# Patient Record
Sex: Female | Born: 1990 | Race: White | Hispanic: No | Marital: Single | Smoking: Current every day smoker
Health system: Southern US, Community
[De-identification: ages and names within clinical notes are randomized; demographics above are authoritative.]

## PROBLEM LIST (undated history)

## (undated) DIAGNOSIS — J45909 Unspecified asthma, uncomplicated: Secondary | ICD-10-CM

## (undated) DIAGNOSIS — T7840XA Allergy, unspecified, initial encounter: Secondary | ICD-10-CM

## (undated) DIAGNOSIS — F32A Depression, unspecified: Secondary | ICD-10-CM

## (undated) DIAGNOSIS — F329 Major depressive disorder, single episode, unspecified: Secondary | ICD-10-CM

## (undated) HISTORY — DX: Allergy, unspecified, initial encounter: T78.40XA

## (undated) HISTORY — DX: Unspecified asthma, uncomplicated: J45.909

## (undated) HISTORY — DX: Depression, unspecified: F32.A

## (undated) HISTORY — DX: Major depressive disorder, single episode, unspecified: F32.9

---
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
## 2012-10-11 ENCOUNTER — Ambulatory Visit (INDEPENDENT_AMBULATORY_CARE_PROVIDER_SITE_OTHER): Payer: Commercial Managed Care - PPO | Admitting: Internal Medicine

## 2012-10-11 VITALS — BP 128/83 | HR 94 | Temp 98.2°F | Resp 16 | Ht 71.0 in | Wt 277.2 lb

## 2012-10-11 DIAGNOSIS — N912 Amenorrhea, unspecified: Secondary | ICD-10-CM

## 2012-10-11 DIAGNOSIS — Z6838 Body mass index (BMI) 38.0-38.9, adult: Secondary | ICD-10-CM

## 2012-10-11 DIAGNOSIS — F17200 Nicotine dependence, unspecified, uncomplicated: Secondary | ICD-10-CM

## 2012-10-11 LAB — POCT URINE PREGNANCY: Preg Test, Ur: POSITIVE

## 2012-10-11 NOTE — Progress Notes (Signed)
  Subjective:    Patient ID: Donna Lloyd, female    DOB: 1991/02/08, 22 y.o.   MRN: [id number]  HPI1 week late Nausea all week vom x 4 today in early am Partner 7 mos -no contracept  No abd/pelv pain No vag d/c  Never preg Review of Systems     Objective:   Physical Exam BP 128/83  Pulse 94  Temp 98.2 F (36.8 C) (Oral)  Resp 16  Ht 5\' 11"  (1.803 m)  Wt 277 lb 3.2 oz (125.737 kg)  BMI 38.66 kg/m2  SpO2 100% NAD Oriented Mood good       Assessment & Plan:  Amenorrhea due to early Pregnancy  Wants to discuss alternatives w/ current partner To [facility]-maternity if carrying to term

## 2012-10-12 ENCOUNTER — Encounter: Payer: Self-pay | Admitting: Internal Medicine

## 2012-10-12 DIAGNOSIS — Z6838 Body mass index (BMI) 38.0-38.9, adult: Secondary | ICD-10-CM | POA: Insufficient documentation

## 2012-10-12 DIAGNOSIS — F17200 Nicotine dependence, unspecified, uncomplicated: Secondary | ICD-10-CM | POA: Insufficient documentation

## 2019-06-22 ENCOUNTER — Other Ambulatory Visit: Payer: Self-pay | Admitting: Registered"

## 2019-06-22 DIAGNOSIS — Z20822 Contact with and (suspected) exposure to covid-19: Secondary | ICD-10-CM

## 2019-06-23 LAB — NOVEL CORONAVIRUS, NAA: SARS-CoV-2, NAA: NOT DETECTED

## 2019-08-11 ENCOUNTER — Other Ambulatory Visit: Payer: Self-pay

## 2019-08-11 DIAGNOSIS — Z20822 Contact with and (suspected) exposure to covid-19: Secondary | ICD-10-CM

## 2019-08-12 LAB — NOVEL CORONAVIRUS, NAA: SARS-CoV-2, NAA: DETECTED — AB

## 2019-11-19 ENCOUNTER — Encounter: Payer: Self-pay | Admitting: Emergency Medicine

## 2019-11-19 ENCOUNTER — Emergency Department (HOSPITAL_COMMUNITY): Payer: No Typology Code available for payment source

## 2019-11-19 ENCOUNTER — Emergency Department (HOSPITAL_COMMUNITY)
Admission: EM | Admit: 2019-11-19 | Discharge: 2019-11-19 | Disposition: A | Discharge status: Home / Self Care | Payer: No Typology Code available for payment source | Attending: Emergency Medicine | Admitting: Emergency Medicine

## 2019-11-19 DIAGNOSIS — M546 Pain in thoracic spine: Secondary | ICD-10-CM | POA: Insufficient documentation

## 2019-11-19 DIAGNOSIS — R519 Headache, unspecified: Secondary | ICD-10-CM | POA: Diagnosis not present

## 2019-11-19 DIAGNOSIS — F17210 Nicotine dependence, cigarettes, uncomplicated: Secondary | ICD-10-CM | POA: Insufficient documentation

## 2019-11-19 DIAGNOSIS — S0990XA Unspecified injury of head, initial encounter: Secondary | ICD-10-CM | POA: Insufficient documentation

## 2019-11-19 DIAGNOSIS — M542 Cervicalgia: Secondary | ICD-10-CM | POA: Insufficient documentation

## 2019-11-19 DIAGNOSIS — Y939 Activity, unspecified: Secondary | ICD-10-CM | POA: Insufficient documentation

## 2019-11-19 DIAGNOSIS — Y929 Unspecified place or not applicable: Secondary | ICD-10-CM | POA: Diagnosis not present

## 2019-11-19 DIAGNOSIS — M545 Low back pain, unspecified: Secondary | ICD-10-CM

## 2019-11-19 DIAGNOSIS — Y999 Unspecified external cause status: Secondary | ICD-10-CM | POA: Diagnosis not present

## 2019-11-19 LAB — I-STAT BETA HCG BLOOD, ED (MC, WL, AP ONLY): I-stat hCG, quantitative: <5 m[IU]/mL (ref <5)

## 2019-11-19 MED ORDER — METHOCARBAMOL 500 MG PO TABS: 500.0000 mg | ORAL_TABLET | Freq: Two times a day (BID) | ORAL | 0 refills | Status: DC

## 2019-11-19 MED ORDER — ACETAMINOPHEN 325 MG PO TABS
650.0000 mg | ORAL_TABLET | Freq: Once | ORAL | Status: AC
  Administered 2019-11-19: 650 mg via ORAL
  Filled 2019-11-19: qty 2

## 2019-11-19 NOTE — ED Notes (Signed)
Patient transported to imaging.

## 2019-11-19 NOTE — Discharge Instructions (Addendum)
Take Tylenol or Motrin as needed for pain.  Take Robaxin as a for muscle spasms.  Do not drive or operate heavy machinery on Robaxin.  Follow-up with your primary care provider in 2 days for continued evaluation.  Return to the emergency room immediately for new or worsening symptoms or concerns such as difficulty urinating, pooping on yourself, numbness, tingling, difficulty walking or any concerns at all.
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
## 2019-11-19 NOTE — ED Notes (Signed)
Patient Alert and oriented to baseline. Stable and ambulatory to baseline. Patient verbalized understanding of the discharge instructions.  Patient belongings were taken by the patient.   

## 2020-04-28 DIAGNOSIS — G8929 Other chronic pain: Secondary | ICD-10-CM | POA: Diagnosis not present

## 2020-04-28 DIAGNOSIS — M5441 Lumbago with sciatica, right side: Secondary | ICD-10-CM | POA: Diagnosis not present

## 2020-04-28 DIAGNOSIS — M5442 Lumbago with sciatica, left side: Secondary | ICD-10-CM | POA: Diagnosis not present
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
## 2021-01-24 DIAGNOSIS — M5430 Sciatica, unspecified side: Secondary | ICD-10-CM | POA: Diagnosis not present

## 2021-01-24 DIAGNOSIS — Z3040 Encounter for surveillance of contraceptives, unspecified: Secondary | ICD-10-CM | POA: Diagnosis not present

## 2021-01-24 DIAGNOSIS — Z01419 Encounter for gynecological examination (general) (routine) without abnormal findings: Secondary | ICD-10-CM | POA: Diagnosis not present

## 2021-01-24 DIAGNOSIS — Z01411 Encounter for gynecological examination (general) (routine) with abnormal findings: Secondary | ICD-10-CM | POA: Diagnosis not present

## 2021-01-24 DIAGNOSIS — Z124 Encounter for screening for malignant neoplasm of cervix: Secondary | ICD-10-CM | POA: Diagnosis not present

## 2021-01-24 DIAGNOSIS — M545 Low back pain, unspecified: Secondary | ICD-10-CM | POA: Diagnosis not present

## 2021-02-02 DIAGNOSIS — Z30430 Encounter for insertion of intrauterine contraceptive device: Secondary | ICD-10-CM | POA: Diagnosis not present

## 2021-02-13 DIAGNOSIS — Z113 Encounter for screening for infections with a predominantly sexual mode of transmission: Secondary | ICD-10-CM | POA: Diagnosis not present

## 2021-02-13 DIAGNOSIS — A5900 Urogenital trichomoniasis, unspecified: Secondary | ICD-10-CM | POA: Diagnosis not present

## 2021-02-13 DIAGNOSIS — Z118 Encounter for screening for other infectious and parasitic diseases: Secondary | ICD-10-CM | POA: Diagnosis not present

## 2021-02-16 DIAGNOSIS — A5901 Trichomonal vulvovaginitis: Secondary | ICD-10-CM | POA: Diagnosis not present

## 2021-02-16 DIAGNOSIS — Z113 Encounter for screening for infections with a predominantly sexual mode of transmission: Secondary | ICD-10-CM | POA: Diagnosis not present

## 2021-02-16 DIAGNOSIS — N760 Acute vaginitis: Secondary | ICD-10-CM | POA: Diagnosis not present

## 2021-02-16 DIAGNOSIS — B373 Candidiasis of vulva and vagina: Secondary | ICD-10-CM | POA: Diagnosis not present

## 2021-02-23 DIAGNOSIS — A749 Chlamydial infection, unspecified: Secondary | ICD-10-CM | POA: Diagnosis not present

## 2021-03-21 DIAGNOSIS — M545 Low back pain, unspecified: Secondary | ICD-10-CM | POA: Diagnosis not present

## 2021-03-21 DIAGNOSIS — Z789 Other specified health status: Secondary | ICD-10-CM | POA: Diagnosis not present

## 2021-03-21 DIAGNOSIS — Z7409 Other reduced mobility: Secondary | ICD-10-CM | POA: Diagnosis not present

## 2021-03-27 DIAGNOSIS — Z789 Other specified health status: Secondary | ICD-10-CM | POA: Diagnosis not present

## 2021-03-27 DIAGNOSIS — Z7409 Other reduced mobility: Secondary | ICD-10-CM | POA: Diagnosis not present

## 2021-03-27 DIAGNOSIS — M545 Low back pain, unspecified: Secondary | ICD-10-CM | POA: Diagnosis not present

## 2021-04-10 DIAGNOSIS — M545 Low back pain, unspecified: Secondary | ICD-10-CM | POA: Diagnosis not present

## 2021-04-10 DIAGNOSIS — Z789 Other specified health status: Secondary | ICD-10-CM | POA: Diagnosis not present

## 2021-04-10 DIAGNOSIS — Z7409 Other reduced mobility: Secondary | ICD-10-CM | POA: Diagnosis not present

## 2021-05-03 DIAGNOSIS — Z113 Encounter for screening for infections with a predominantly sexual mode of transmission: Secondary | ICD-10-CM | POA: Diagnosis not present

## 2021-05-16 DIAGNOSIS — Z30433 Encounter for removal and reinsertion of intrauterine contraceptive device: Secondary | ICD-10-CM | POA: Diagnosis not present

## 2021-05-16 DIAGNOSIS — I10 Essential (primary) hypertension: Secondary | ICD-10-CM | POA: Diagnosis not present

## 2021-07-16 DIAGNOSIS — G43911 Migraine, unspecified, intractable, with status migrainosus: Secondary | ICD-10-CM | POA: Diagnosis not present

## 2021-08-14 DIAGNOSIS — Z30431 Encounter for routine checking of intrauterine contraceptive device: Secondary | ICD-10-CM | POA: Diagnosis not present

## 2021-08-14 DIAGNOSIS — F17210 Nicotine dependence, cigarettes, uncomplicated: Secondary | ICD-10-CM | POA: Diagnosis not present

## 2021-08-14 DIAGNOSIS — I10 Essential (primary) hypertension: Secondary | ICD-10-CM | POA: Diagnosis not present

## 2021-09-14 DIAGNOSIS — R059 Cough, unspecified: Secondary | ICD-10-CM | POA: Diagnosis not present

## 2021-09-14 DIAGNOSIS — J029 Acute pharyngitis, unspecified: Secondary | ICD-10-CM | POA: Diagnosis not present

## 2021-09-14 DIAGNOSIS — J101 Influenza due to other identified influenza virus with other respiratory manifestations: Secondary | ICD-10-CM | POA: Diagnosis not present

## 2021-09-14 DIAGNOSIS — R509 Fever, unspecified: Secondary | ICD-10-CM | POA: Diagnosis not present

## 2022-01-29 ENCOUNTER — Other Ambulatory Visit (HOSPITAL_COMMUNITY)
Admission: EM | Admit: 2022-01-29 | Discharge: 2022-01-31 | Disposition: A | Discharge status: Home / Self Care | Payer: BC Managed Care – PPO | Attending: Psychiatry | Admitting: Psychiatry

## 2022-01-29 DIAGNOSIS — Z79899 Other long term (current) drug therapy: Secondary | ICD-10-CM | POA: Diagnosis not present

## 2022-01-29 DIAGNOSIS — Z20822 Contact with and (suspected) exposure to covid-19: Secondary | ICD-10-CM | POA: Diagnosis not present

## 2022-01-29 DIAGNOSIS — F32A Depression, unspecified: Secondary | ICD-10-CM

## 2022-01-29 DIAGNOSIS — F329 Major depressive disorder, single episode, unspecified: Secondary | ICD-10-CM | POA: Insufficient documentation

## 2022-01-29 DIAGNOSIS — J45909 Unspecified asthma, uncomplicated: Secondary | ICD-10-CM | POA: Insufficient documentation

## 2022-01-29 DIAGNOSIS — F411 Generalized anxiety disorder: Secondary | ICD-10-CM | POA: Insufficient documentation

## 2022-01-29 DIAGNOSIS — I10 Essential (primary) hypertension: Secondary | ICD-10-CM | POA: Insufficient documentation

## 2022-01-29 DIAGNOSIS — R45851 Suicidal ideations: Secondary | ICD-10-CM | POA: Diagnosis not present

## 2022-01-29 DIAGNOSIS — F419 Anxiety disorder, unspecified: Secondary | ICD-10-CM | POA: Diagnosis not present

## 2022-01-29 LAB — CBC WITH DIFFERENTIAL/PLATELET
Abs Immature Granulocytes: 0.03 10*3/uL (ref 0.00–0.07)
Basophils Absolute: 0.1 10*3/uL (ref 0.0–0.1)
Basophils Relative: 1 %
Eosinophils Absolute: 0.2 10*3/uL (ref 0.0–0.5)
Eosinophils Relative: 2 %
HCT: 42.4 % (ref 36.0–46.0)
Hemoglobin: 13.7 g/dL (ref 12.0–15.0)
Immature Granulocytes: 0 %
Lymphocytes Relative: 34 %
Lymphs Abs: 3.5 10*3/uL (ref 0.7–4.0)
MCH: 28.5 pg (ref 26.0–34.0)
MCHC: 32.3 g/dL (ref 30.0–36.0)
MCV: 88.3 fL (ref 80.0–100.0)
Monocytes Absolute: 0.6 10*3/uL (ref 0.1–1.0)
Monocytes Relative: 6 %
Neutro Abs: 6 10*3/uL (ref 1.7–7.7)
Neutrophils Relative %: 57 %
Platelets: 246 10*3/uL (ref 150–400)
RBC: 4.8 MIL/uL (ref 3.87–5.11)
RDW: 12.9 % (ref 11.5–15.5)
WBC: 10.4 10*3/uL (ref 4.0–10.5)
nRBC: 0 % (ref 0.0–0.2)

## 2022-01-29 LAB — COMPREHENSIVE METABOLIC PANEL
ALT: 34 U/L (ref 0–44)
AST: 21 U/L (ref 15–41)
Albumin: 4 g/dL (ref 3.5–5.0)
Alkaline Phosphatase: 57 U/L (ref 38–126)
Anion gap: 7 (ref 5–15)
BUN: 14 mg/dL (ref 6–20)
CO2: 26 mmol/L (ref 22–32)
Calcium: 9.2 mg/dL (ref 8.9–10.3)
Chloride: 108 mmol/L (ref 98–111)
Creatinine, Ser: 0.72 mg/dL (ref 0.44–1.00)
GFR, Estimated: >60 mL/min (ref >60)
Glucose, Bld: 82 mg/dL (ref 70–99)
Potassium: 3.9 mmol/L (ref 3.5–5.1)
Sodium: 141 mmol/L (ref 135–145)
Total Bilirubin: 0.9 mg/dL (ref 0.3–1.2)
Total Protein: 6.5 g/dL (ref 6.5–8.1)

## 2022-01-29 LAB — POCT PREGNANCY, URINE: Preg Test, Ur: NEGATIVE

## 2022-01-29 LAB — POCT URINE DRUG SCREEN - MANUAL ENTRY (I-SCREEN)
POC Amphetamine UR: NOT DETECTED
POC Buprenorphine (BUP): NOT DETECTED
POC Cocaine UR: NOT DETECTED
POC Marijuana UR: NOT DETECTED
POC Methadone UR: NOT DETECTED
POC Methamphetamine UR: NOT DETECTED
POC Morphine: NOT DETECTED
POC Oxazepam (BZO): POSITIVE — AB
POC Oxycodone UR: NOT DETECTED
POC Secobarbital (BAR): NOT DETECTED

## 2022-01-29 LAB — RESP PANEL BY RT-PCR (FLU A&B, COVID) ARPGX2
Influenza A by PCR: NEGATIVE
Influenza B by PCR: NEGATIVE
SARS Coronavirus 2 by RT PCR: NEGATIVE

## 2022-01-29 LAB — POC SARS CORONAVIRUS 2 AG: SARSCOV2ONAVIRUS 2 AG: NEGATIVE

## 2022-01-29 LAB — HEMOGLOBIN A1C
Hgb A1c MFr Bld: 5.1 % (ref 4.8–5.6)
Mean Plasma Glucose: 99.67 mg/dL

## 2022-01-29 LAB — TSH: TSH: 2.827 u[IU]/mL (ref 0.350–4.500)

## 2022-01-29 LAB — ETHANOL: Alcohol, Ethyl (B): <10 mg/dL (ref <10)

## 2022-01-29 MED ORDER — HYDROXYZINE HCL 25 MG PO TABS
25.0000 mg | ORAL_TABLET | Freq: Three times a day (TID) | ORAL | Status: DC | PRN
  Filled 2022-01-29: qty 10

## 2022-01-29 MED ORDER — HYDROCHLOROTHIAZIDE 12.5 MG PO TABS
12.5000 mg | ORAL_TABLET | Freq: Every day | ORAL | Status: DC
  Administered 2022-01-30 – 2022-01-31 (×2): 12.5 mg via ORAL
  Filled 2022-01-29 (×2): qty 1

## 2022-01-29 MED ORDER — ACETAMINOPHEN 325 MG PO TABS
650.0000 mg | ORAL_TABLET | Freq: Four times a day (QID) | ORAL | Status: DC | PRN
  Administered 2022-01-30 – 2022-01-31 (×3): 650 mg via ORAL
  Filled 2022-01-29 (×4): qty 2

## 2022-01-29 MED ORDER — MAGNESIUM HYDROXIDE 400 MG/5ML PO SUSP: 30.0000 mL | Freq: Every day | ORAL | Status: DC | PRN

## 2022-01-29 MED ORDER — ALUM & MAG HYDROXIDE-SIMETH 200-200-20 MG/5ML PO SUSP: 30.0000 mL | ORAL | Status: DC | PRN
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
## 2022-01-29 NOTE — ED Provider Notes (Signed)
[facility] Continuous Assessment Admission H&P ? ?Date: 01/30/22 ?Patient Name: Donna Lloyd ?MRN: [id number] ?Chief Complaint: No chief complaint on file. ?   ? ?Diagnoses:  ?Final diagnoses:  ?Suicidal ideation  ?Depression, unspecified depression type  ?Anxious mood  ? ? ?HPI: Donna Lloyd,  31 y.o female present to [facility] for suicidal ideation and depression,  per the patient thing has not been going good in her life.  She feel overwhelm at this point and don't feel safe going home at this point.   ? ?.Copied from TTS notes: Pt stated that in the last month she has had "hit after hit" of negative occurences. 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 Pt endorsed passive SI in the form of thinking "it would be easier if I weren't here." Pt denied any plan or true intent of acting to kill herself. Pt endorsed thoughts at times when her daughter is disagreeable of harming her. Pt categorized her thoughts as "passing thoughts" and stated strongly "but I would never harm her." Pt reported that she has never attempted to kill herself but had SI with a plan "back in high school." Pt stated she was hospitalized after that episode and went through months of OP therapy following the attempt. Pt reported a hx of self-harming through superficial cutting which had stopped for about 10 years until January 2023. Pt stated that she does not SH often but is continuing to cut herself periodically.  ? ?Observation of patient,  she is alert and oriented x 4,  speech is clear,  mood depressed and teary,  affect congruent with mood.  Pt endorse SI with no immediate plan.  Pt denies AVH,  HI or paranoia,  pt report she vape,  drink alcohol 1-2 time weekly (2-3 shot at time).  Pt report there is a gun in the  home but it locked away and the key are no accessible by her.   Per the patient she does not feel safe going home tonight because of her mental state.   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  Pt reported she was diagnose with depression when she was a teenager but not currently taking any medications.   ? ?Recommend inpatient observation  ? ?PHQ 2-9:   ?[facility]  ?C-SSRS RISK CATEGORY High Risk  ? ?  ?  ? ?Total Time spent with patient: 20 minutes ? ?Musculoskeletal  ?Strength & Muscle Tone: within normal limits ?Gait & Station: normal ?Patient leans: N/A ? ?Psychiatric Specialty Exam  ?Presentation ?General Appearance: Casual ? ?Eye Contact:Good ? ?Speech:Clear and Coherent ? ?Speech Volume:Normal ? ?Handedness:Ambidextrous ? ? ?Mood and Affect  ?Mood:Depressed; Anxious ? ?Affect:Appropriate ? ? ?Thought Process  ?Thought Processes:Coherent ? ?Descriptions of Associations:Circumstantial ? ?Orientation:Full (Time, Place and Person) ? ?Thought Content:Abstract Reasoning ?   ?Hallucinations:Hallucinations: None ? ?Ideas of Reference:None ? ?Suicidal Thoughts:Suicidal Thoughts: Yes, Passive ?SI Passive Intent and/or Plan: Without Plan ? ?Homicidal Thoughts:Homicidal Thoughts: No ? ? ?Sensorium  ?Memory:Immediate Good ? ?Judgment:Fair ? ?Insight:Fair ? ? ?Executive Functions  ?Concentration:Fair ? ?Attention Span:Fair ? ?Recall:Fair ? ?Fund of Knowledge:Fair ? ?Language:Fair ? ? ?Psychomotor Activity  ?Psychomotor Activity:Psychomotor Activity: Normal ? ? ?Assets  ?Assets:No data recorded ? ?  Sleep  ?Sleep:Sleep: Fair ? ? ?Nutritional Assessment (For OBS and [facility] admissions only) ?Has the patient had a weight loss or gain of 10 pounds or more in the last 3 months?: No ?Has the patient had a decrease in food intake/or appetite?: No ?Does the patient have dental problems?: No ?Does the patient have eating habits or behaviors that may be indicators of  an eating disorder including binging or inducing vomiting?: No ?Has the patient recently lost weight without trying?: 0 ?Has the patient been eating poorly because of a decreased appetite?: 0 ?Malnutrition Screening Tool Score: 0 ? ? ? ?Physical Exam ?HENT:  ?   Head: Normocephalic.  ?   Nose: Congestion present.  ?Cardiovascular:  ?   Rate and Rhythm: Normal rate.  ?Pulmonary:  ?   Effort: Pulmonary effort is normal.  ?Musculoskeletal:     ?   General: Normal range of motion.  ?   Cervical back: Normal range of motion.  ?Skin: ?   General: Skin is warm.  ?Neurological:  ?   General: No focal deficit present.  ?   Mental Status: She is alert.  ?Psychiatric:     ?   Mood and Affect: Mood normal.     ?   Behavior: Behavior normal.     ?   Thought Content: Thought content normal.     ?   Judgment: Judgment normal.  ? ?Review of Systems  ?Constitutional: Negative.   ?HENT: Negative.    ?Eyes: Negative.   ?Respiratory: Negative.    ?Cardiovascular: Negative.   ?Gastrointestinal: Negative.   ?Genitourinary: Negative.   ?Musculoskeletal: Negative.   ?Skin: Negative.   ?Neurological: Negative.   ?Endo/Heme/Allergies: Negative.   ?Psychiatric/Behavioral:  Positive for depression and suicidal ideas. The patient is nervous/anxious.   ? ?Resp. rate (P) 16. There is no height or weight on file to calculate BMI. ? ?Past Psychiatric History: depression   ? ?Is the patient at risk to self? Yes  ?Has the patient been a risk to self in the past 6 months? Yes .    ?Has the patient been a risk to self within the distant past? Yes   ?Is the patient a risk to others? No   ?Has the patient been a risk to others in the past 6 months? No   ?Has the patient been a risk to others within the distant past? No  ? ?Past Medical History:  ?Past Medical History:  ?Diagnosis Date  ? Allergy   ? Asthma   ? Depression   ? No past surgical history on file. ? ?Family History: No family history on file. ? ?Social History:  ?Social History   ? ?Socioeconomic History  ? Marital status: Single  ?  Spouse name: Not on file  ? Number of children: Not on file  ? Years of education: Not on file  ? Highest education level: Not on file  ?Occupational History  ? Not on file  ?Tobacco Use  ? Smoking status: Every Day  ? Smokeless tobacco: Current  ?Substance and Sexual Activity  ? Alcohol use: Yes  ? Drug use: Never  ? Sexual activity: Not on file  ?Other Topics Concern  ? Not on file  ?Social History Narrative  ? Not on file  ? ?Social Determinants of Health  ? ?Financial Resource Strain: Not on file  ?Food Insecurity: Not on file  ?Transportation Needs: Not on file  ?Physical Activity: Not on file  ?Stress: Not on file  ?  Social Connections: Not on file  ?Intimate Partner Violence: Not on file  ? ? ?SDOH:  ?SDOH Screenings  ? ?Alcohol Screen: Not on file  ?Depression (PHQ2-9): Not on file  ?Financial Resource Strain: Not on file  ?Food Insecurity: Not on file  ?Housing: Not on file  ?Physical Activity: Not on file  ?Social Connections: Not on file  ?Stress: Not on file  ?Tobacco Use: Not on file  ?Transportation Needs: Not on file  ? ? ?Last Labs:  ?Admission on 01/29/2022  ?Component Date Value Ref Range Status  ? SARS Coronavirus 2 by RT PCR 01/29/2022 NEGATIVE  NEGATIVE Final  ? Comment: (NOTE) ?SARS-CoV-2 target nucleic acids are NOT DETECTED. ? ?The SARS-CoV-2 RNA is generally detectable in upper respiratory ?specimens during the acute phase of infection. The lowest ?concentration of SARS-CoV-2 viral copies this assay can detect is ?138 copies/mL. A negative result does not preclude SARS-Cov-2 ?infection and should not be used as the sole basis for treatment or ?other patient management decisions. A negative result may occur with  ?improper specimen collection/handling, submission of specimen other ?than nasopharyngeal swab, presence of viral mutation(s) within the ?areas targeted by this assay, and inadequate number of viral ?copies(<138 copies/mL). A  negative result must be combined with ?clinical observations, patient history, and epidemiological ?information. The expected result is Negative. ? ?Fact Sheet for Patients:  ?[email or web address]

## 2022-01-29 NOTE — ED Notes (Signed)
Patient is alert and oriented x 4. Patient was very ?

## 2022-01-29 NOTE — Progress Notes (Signed)
?   01/29/22 1844  ?[facility] Triage Screening (Walk-ins at [facility] only)  ?How Did You Hear About [place]? Self  ?What Is the Reason for Your Visit/Call Today? Pt is a 31 yo female who presented voluntarily and accompanied by a friend who did not participate in the assessment.  Pt reported worsening depression over the last few months beginning in late December when her best friend attempted suicide which resulted medical issues her friend is recovering from now. Pt stated that in the last month she has had "hit after hit" of negative occurences. 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 Pt endorsed passive SI in the form of thinking "it would be easier if I weren't here." Pt denied any plan or true intent of acting to kill herself. Pt endorsed thoughts at times when her daughter is disagreeable of harming her. Pt categorized her thoughts as "passing thoughts" and stated strongly "but I would never harm her." Pt reported that she has never attempted to kill herself but had SI with a plan "back in high school." Pt stated she was hospitalized after that episode and went through months of OP therapy following the attempt. Pt reported a hx of self-harming through superficial cutting which had stopped for about 10 years until January 2023. Pt stated that she does not SH often but is continuing to cut herself periodically. Pt reported regular use of alcohol (1-2 times per week) and xanax (use of 10 pills over a month's time.) Pt is not prescribed Xanax or any other medications and does not currently have an OP therapist.  ?How Long Has This Been Causing You Problems? 1-6 months  ?Have You Recently Had Any Thoughts About Hurting Yourself? Yes  ?How long ago did you have thoughts about hurting yourself? today   ?Are You Planning to Commit Suicide/Harm Yourself At This time? No  ?Have you Recently Had Thoughts About Hurting Someone Karolee Ohs? Yes  ?How long ago did you have thoughts of harming others? last night  ?Are You Planning To Harm Someone At This Time? No  ?Are you currently experiencing any auditory, visual or other hallucinations? No  ?Have You Used Any Alcohol or Drugs in the Past 24 Hours? Yes  ?How long ago did you use Drugs or Alcohol? Xanax and alcohol yesterday and today  ?What Did You Use and How Much? 10 shots of alcohol  ?Clinician description of patient physical appearance/behavior: Pt was calm, cooperative, alert and seemed fully oriented. Pt did not appear to be responding to internal stiluli or experiencing delsuional thinking. Pt's mood was depressed and she was tearful. Her flat, tearful affect was congruent. Pt's judgment and insight seemed fair. Pt was casually dressed and seemed adequately groomed. Pt's speech and movement was normal.  ?What Do You Feel Would Help You the Most Today? Treatment for Depression or other mood problem  ?If access to [facility] was not available, would you have sought care in the Emergency Department? Yes  ?Determination of Need Routine (7 days)  ?Options For Referral Outpatient Therapy;Medication Management  ? ?[place] T. Jimmye Norman, MS, [facility], [facility] ?Triage Specialist ?[place] ? ?

## 2022-01-29 NOTE — ED Notes (Signed)
Patient is alert and oriented x 4. Patient was very tearful when asked what precipitated her current admission. 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 She also found out that her boyfriend was cheating on her and seeing prostitutes. There are many factors that have contributed to her current depression and anxiety. Patient states that she feels better now that she is not alone. She feels safe here.Patient is resting quietly with eyes closed. No S/S of distress, respirations are even and unlabored. Will contiune to monitor for safety. ?

## 2022-01-30 DIAGNOSIS — R45851 Suicidal ideations: Secondary | ICD-10-CM | POA: Diagnosis not present

## 2022-01-30 MED ORDER — ALBUTEROL SULFATE HFA 108 (90 BASE) MCG/ACT IN AERS: 1.0000 | INHALATION_SPRAY | RESPIRATORY_TRACT | Status: DC | PRN

## 2022-01-30 MED ORDER — CITALOPRAM HYDROBROMIDE 20 MG PO TABS
20.0000 mg | ORAL_TABLET | Freq: Every day | ORAL | Status: DC
  Administered 2022-01-30 – 2022-01-31 (×2): 20 mg via ORAL
  Filled 2022-01-30: qty 1
  Filled 2022-01-30: qty 7
  Filled 2022-01-30: qty 1

## 2022-01-30 NOTE — ED Notes (Signed)
Pt went for EKG ?

## 2022-01-30 NOTE — ED Notes (Signed)
Pt is currently sleeping, no distress noted, environmental check complete, will continue to monitor patient for safety. ? ?

## 2022-01-30 NOTE — ED Notes (Signed)
Pt tearful during assessment. Pt continue to endorse SI no plan/intent. Denies HI/AVH. Pt states, "I have so much going on in my life right now that I can't think straight. But, I wouldn't do anything to hurt myself. I have a 29 yr old daughter who is my world. I would never put her through that". Praise and encouragement provided. Informed pt to notify staff with any needs, concerns or impulses or urges to harm self. Will continue to monitor for safety. ?

## 2022-01-30 NOTE — ED Provider Notes (Addendum)
Facility Based Crisis Admission H&P ? ?Date: 01/30/22 ?Patient Name: Donna Lloyd ?MRN: [id number] ?Chief Complaint: No chief complaint on file. ?   ?Diagnoses:  ?Final diagnoses:  ?Suicidal ideation  ?Depression, unspecified depression type  ?Anxious mood  ? ?HPI:   ? ?Pt assessed face to face by NP. ?  ?Pt reports depressed mood. Reports depression has been present since age 31 or 31 years old, worsening in the last 6-7 months. Reports multiple recent stressors, including her job, relationship, car, living situation. Reports she found out that her boyfriend was "texting hookers".  ?  ?Endorses passive SI, "thinking it would be better it I wasn't here", "be less of a burden". Denies active SI. Denies current plan. Pt states she is able to contract to safety when she has her daughter, which is from [place]. Pt states she is unable to contract to safety when she does not have her daughter.  ?  ?Denies hx of SA.  ?  ?Reports hx of NSSI, cutting self. Reports last occurred in January 2023.  ? ?Denies HI/VI.  ?  ?Denies AVH, paranoia, delusions. ?  ?Reports 1 inpatient psychiatric hospitalization in high school, estimates around 2010, for "suicidal thoughts". ?  ?Pt is not currently connected w/ counseling or medication management. Reports prior medication trial of celexa, which was effective for depressive sx. Had last taken celexa prior to her pregnancy.  ?  ?Pt is currently living w/ her boyfriend. States she will need to figure out housing as she plans on ending this relationship. Reports her boyfriend has a firearm he keeps locked away. She does not have access. ?  ?Endorses use of Xanax, which she uses PRN, 2-3 times/week. She is not rx'd Xanax. Endorses use of alcohol, 2-3 shots, 2-3 times/week. Provided psychoeducation that pt should not use medications that are not prescribed for her, pt should not use alcohol and benzodiazepines together. Pt states she has not been using alcohol and benzodiazepines  together. Reports hx of crack/cocaine use, last use was 4 years ago. Denies use of methamphetamines, amphetamines, other substances. ?  ?[facility] to restart [facility]lexa. Pt agrees w/ plan. Will start celexa 20mg  QD. Side effects/adverse effects and black box warning of celexa reviewed w/ pt. Pt verbalized understanding and gave verbal consent. ? ?Pt is a&ox3. Appears casual. Eye contact is good. Speech is clear and coherent, w/ nml rate and volume. Reported mood is depressed. Affect is congruent, tearful. TP is coherent, goal directed, linear. Description of associations is intact. TC is logical. There is no evidence of internal preoccupation, agitation, aggression, or distractibility. No paranoia or delusions elicited. ? ?PHQ 2-9:   ?[facility]  ?C-SSRS RISK CATEGORY High Risk  ? ?  ?  ?Total Time spent with patient: 15 minutes ? ?Musculoskeletal  ?Strength & Muscle Tone: within normal limits ?Gait & Station: normal ?Patient leans: N/A ? ?Psychiatric Specialty Exam  ?Presentation ?General Appearance: Casual ? ?Eye Contact:Good ? ?Speech:Clear and Coherent; Normal Rate ? ?Speech Volume:Normal ? ?Handedness: ? ?Mood and Affect  ?Mood:Depressed ? ?Affect:Congruent; Tearful ? ?Thought Process  ?Thought Processes:Coherent; Goal Directed; Linear ? ?Descriptions of Associations:Intact ? ?Orientation:Full (Time, Place and Person) ? ?Thought Content:Logical ?   ?Hallucinations:Hallucinations: None ? ?Ideas of Reference:None ? ?Suicidal Thoughts:Suicidal Thoughts: Yes, Passive ?SI Passive Intent and/or Plan: Without Plan ? ?Homicidal Thoughts:Homicidal Thoughts: No ? ?Sensorium  ?Memory:Immediate Good ? ?Judgment:Fair ? ?Insight:Fair ? ? ?Executive Functions  ?  Concentration:Fair ? ?Attention Span:Fair ? ?Recall:Fair ? ?Fund of Knowledge:Fair ? ?Language:Fair ? ? ?Psychomotor Activity  ?Psychomotor Activity:Psychomotor Activity:  Normal ? ? ?Assets  ?Assets:Communication Skills; Desire for Improvement; Social Support ? ? ?Sleep  ?Sleep:Sleep: Fair ?Number of Hours of Sleep: 3 ? ? ?Nutritional Assessment (For [facility] and [facility] admissions only) ?Has the patient had a weight loss or gain of 10 pounds or more in the last 3 months?: No ?Has the patient had a decrease in food intake/or appetite?: No ?Does the patient have dental problems?: No ?Does the patient have eating habits or behaviors that may be indicators of an eating disorder including binging or inducing vomiting?: No ?Has the patient recently lost weight without trying?: 0 ?Has the patient been eating poorly because of a decreased appetite?: 0 ?Malnutrition Screening Tool Score: 0 ? ? ? ?Physical Exam ?Cardiovascular:  ?   Rate and Rhythm: Normal rate.  ?Pulmonary:  ?   Effort: Pulmonary effort is normal.  ?Neurological:  ?   Mental Status: She is alert and oriented to person, place, and time.  ?Psychiatric:     ?   Attention and Perception: Attention and perception normal.     ?   Mood and Affect: Mood is depressed. Affect is tearful.     ?   Speech: Speech normal.     ?   Behavior: Behavior is cooperative.     ?   Thought Content: Thought content includes suicidal ideation.     ?   Cognition and Memory: Cognition and memory normal.     ?   Judgment: Judgment normal.  ? ?Review of Systems  ?Constitutional:  Negative for chills and fever.  ?Respiratory:  Negative for shortness of breath.   ?Cardiovascular:  Negative for chest pain.  ?Gastrointestinal:  Negative for abdominal pain.  ?Psychiatric/Behavioral:  Positive for depression and suicidal ideas.   ? ?Blood pressure 129/67, pulse 72, temperature 97.6 ?F (36.4 ?C), temperature source Oral, resp. rate 18, SpO2 100 %. There is no height or weight on file to calculate BMI. ? ?Past Psychiatric History:Reported past hx of depression. ? ?Is the patient at risk to self? Yes  ?Has the patient been a risk to self in the past 6 months? No .    ?Has  the patient been a risk to self within the distant past? Yes   ?Is the patient a risk to others? No   ?Has the patient been a risk to others in the past 6 months? No   ?Has the patient been a risk to others within the distant past? No  ? ?Past Medical History:  ?Past Medical History:  ?Diagnosis Date  ? Allergy   ? Asthma   ? Depression   ? No past surgical history on file. ? ?Family History: No family history on file. ? ?Social History:  ?Social History  ? ?Socioeconomic History  ? Marital status: Single  ?  Spouse name: Not on file  ? Number of children: Not on file  ? Years of education: Not on file  ? Highest education level: Not on file  ?Occupational History  ? Not on file  ?Tobacco Use  ? Smoking status: Every Day  ? Smokeless tobacco: Current  ?Substance and Sexual Activity  ? Alcohol use: Yes  ? Drug use: Never  ? Sexual activity: Not on file  ?Other Topics Concern  ? Not on file  ?Social History Narrative  ? Not on file  ? ?Social Determinants of Health  ? ?  Financial Resource Strain: Not on file  ?Food Insecurity: Not on file  ?Transportation Needs: Not on file  ?Physical Activity: Not on file  ?Stress: Not on file  ?Social Connections: Not on file  ?Intimate Partner Violence: Not on file  ? ? ?SDOH:  ?SDOH Screenings  ? ?Alcohol Screen: Not on file  ?Depression (PHQ2-9): Not on file  ?Financial Resource Strain: Not on file  ?Food Insecurity: Not on file  ?Housing: Not on file  ?Physical Activity: Not on file  ?Social Connections: Not on file  ?Stress: Not on file  ?Tobacco Use: Not on file  ?Transportation Needs: Not on file  ? ? ?Last Labs:  ?Admission on 01/29/2022  ?Component Date Value Ref Range Status  ? SARS Coronavirus 2 by RT PCR 01/29/2022 NEGATIVE  NEGATIVE Final  ? Comment: (NOTE) ?SARS-CoV-2 target nucleic acids are NOT DETECTED. ? ?The SARS-CoV-2 RNA is generally detectable in upper respiratory ?specimens during the acute phase of infection. The lowest ?concentration of SARS-CoV-2 viral  copies this assay can detect is ?138 copies/mL. A negative result does not preclude SARS-Cov-2 ?infection and should not be used as the sole basis for treatment or ?other patient management decisions. A negative result

## 2022-01-30 NOTE — ED Notes (Signed)
Pt. Is attending AA. ?

## 2022-01-30 NOTE — ED Notes (Signed)
Pt. Came out asking for her brush out the locker I offered her a comb. Now she's on the phone talking to her family.  ?

## 2022-01-30 NOTE — ED Notes (Signed)
Pt is in the dining room attending AA ?

## 2022-01-30 NOTE — ED Notes (Signed)
PT complaint of headache, asking for tylenol ?

## 2022-01-30 NOTE — ED Notes (Signed)
Pt on phone with family member. She calm and cooperative. No c/o of pain or distress. Will continue to monitor for safety ?

## 2022-01-30 NOTE — ED Notes (Signed)
Pt eating breakfast 

## 2022-01-30 NOTE — ED Notes (Signed)
Patient continues to rest with no sxs of distress noted - will continue to observe for safety ?

## 2022-01-30 NOTE — ED Notes (Signed)
Pt sleeping in no acute distress. RR even and unlabored. Safety maintained. 

## 2022-01-30 NOTE — Clinical Social Work Psych Note (Signed)
LCSW Initial Note ? ?LCSW met with Rodolfo for introduction and to begin discussions regarding treatment and potential discharge planning. Tayanna presented with a dysphoric affect, congruent mood, however was pleasant and cooperative.  ? ?Athea denied having any current SI, HI or AVH at this time.  ? ?Naevia shared that she came to the [facility] due to increasing depressive symptoms, including suicidal ideations and increasing anxiety. Ashlynne shared that she has a PMHX of depression and substance use.  ? ?Keilany reports that she has currently been under a lot of stress and feels overwhelmed.  ? 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 Rashel shred that the main stressor that triggered this most recent episode was finding out her boyfriend cheating on her.  ? ?Aamani reports that she has experienced an increase in depression, passive suicidal ideation and anxiety symptooms. Noemi reports she lseeps for 2-3 hours a night on average, however she has been able to receive better quality sleep here in the [facility].  ? ?Siyona reports that her main goal is to be stabilized on medications  and be provided with additional resources for outpatient medication management and therapy services. Phyllicia reports she continues to live with her boyfriend, however she is planning to change their living arrangements upon her discharge. ? ?Avonell plans to return home with outpatient psychiatric resources.  ? ?LCSW will continue to follow for any additional questions, concerns or identified needs until discharge.  ? ? ? ?Radonna Ricker, MSW, LCSW ?Clinical Education officer, museum Insurance claims handler) ?[facility]  ? ?

## 2022-01-30 NOTE — Progress Notes (Signed)
Received Donna Lloyd from the [facility] with the [facility], she was presented with the necessay paper work and agreed to the treatment plan. She was oriented to her new environment. She completed her safety plan and a copy was given to her to take home. The second copy was placed in the chart. She endorsed feeling anxious at the present time. She demied feeling suicdal and depressed at this time. She retreated to her room resting in the bed at 1323 hrs. ?

## 2022-01-31 ENCOUNTER — Other Ambulatory Visit: Payer: Self-pay

## 2022-01-31 MED ORDER — HYDROXYZINE HCL 25 MG PO TABS
25.0000 mg | ORAL_TABLET | Freq: Three times a day (TID) | ORAL | 0 refills | Status: DC | PRN
Start: 2022-01-31 — End: 2022-05-03

## 2022-01-31 MED ORDER — HYDROCHLOROTHIAZIDE 12.5 MG PO CAPS: 12.5000 mg | ORAL_CAPSULE | Freq: Every morning | ORAL | 0 refills | Status: DC

## 2022-01-31 MED ORDER — HYDROCHLOROTHIAZIDE 12.5 MG PO TABS
12.5000 mg | ORAL_TABLET | Freq: Every day | ORAL | Status: DC
  Filled 2022-01-31: qty 7

## 2022-01-31 MED ORDER — CITALOPRAM HYDROBROMIDE 20 MG PO TABS: 20.0000 mg | ORAL_TABLET | Freq: Every day | ORAL | 0 refills | Status: DC

## 2022-01-31 NOTE — Group Note (Signed)
Group Topic: Emotional Regulation  ?Group Date: 01/31/2022 ?Start Time: 1200 ?End Time: 1226 ?Facilitators: Doyne Keel E  ?Department: [facility] ? ?Number of Participants: 3  ?Group Focus: activities of daily living skills, coping skills, and self-awareness ?Treatment Modality:  Behavior Modification Therapy and Individual Therapy ?Interventions utilized were patient education and problem solving ?Purpose: enhance coping skills and express feelings ? ?Name: Donna Lloyd Date of Birth: 1991/01/11  ?MR: [id number]   ? ?Level of Participation: minimal ?Quality of Participation: attentive and cooperative ?Interactions with others: gave feedback ?Mood/Affect: appropriate ?Triggers (if applicable): n/a ?Cognition: coherent/clear ?Progress: Moderate ?Response: n/a ?Plan: follow-up needed ? ?Patients Problems:  ?Patient Active Problem List  ? Diagnosis Date Noted  ? Suicidal ideation 01/30/2022  ? Nicotine addiction 10/12/2012  ? BMI 38.0-38.9,adult 10/12/2012  ?  ?

## 2022-01-31 NOTE — ED Provider Notes (Signed)
FBC/OBS ASAP Discharge Summary ? ?Date and Time: 01/31/2022 1:15 PM  ?Name: Donna Lloyd  ?MRN:  [id number]  ? ?Discharge Diagnoses:  ?Final diagnoses:  ?Suicidal ideation  ?Depression, unspecified depression type  ?Anxious mood  ? ? ?Subjective:  ?Patient seen and chart reviewed. She has been medication compliant and has been appropriate with staff and peers on the unit. She reported her mood as "good" today and denied SI/HI/AVH. She states that she has been in contact with her mother and that she will reside with her mother at discharge. She states that her mother went to her ex boyfriend's home and removed her and her daughter's belongings. Only physical complaint this morning is a headache which is not unusual for her; reports a history of headaches. She states that her mother will get off of work at 5/6pm and that she will pick her up.  Patient states that her mother has contacted [facility] and has arranged an appointment for her in the next 4 weeks. She requests discharge at this time. ? ?Stay Summary:  ?31 year old female with history of anxiety and depression who presented to the [facility] behavioral health urgent care on 01/29/2022 with worsening mood and passive SI; was unable to contract for safety.  Patient was admitted to the [facility] observation unit and then transferred to the facility based crisis unit on 01/30/2022 for crisis stabilization.  UDS positive for benzodiazepines; EtOH negative. She was restarted on celexa 20 mg which she historically has had a good response to. She tolerated the medication without issues, no SE/AE. She reported feeling safe for discharge on 01/31/22 and requested discharge.  ? ?On my interview day of discharge, patient is in NAD, alert, oriented, calm, cooperative, and attentive, with normal affect, speech, and behavior. Objectively, there is no evidence of psychosis/ mania (able to converse coherently, linear and goal directed thought, no RIS, no distractibility, not  pre-occupied, no FOI, etc) nor depression to the point of suicidality (able to concentrate, affect full and reactive, speech normal r/v/t, no psychomotor retardation/agitation, etc). She denied SI/HI/AVH as above ? ?Overall, patient appears to be at the point, in the absence of inhibiting or disinhibiting symptoms, where she can successfully move to lesser restrictive setting for care. ? ? ?Total Time spent with patient: 20 minutes ? ?Past Psychiatric History: anxiety, depression ?Past Medical History:  ?Past Medical History:  ?Diagnosis Date  ? Allergy   ? Asthma   ? Depression   ? No past surgical history on file. ?Family History: No family history on file. ?Family Psychiatric History:  ?Mother with depression  ?Father with Alcohol use ?  ?Social History:  ?Social History  ? ?Substance and Sexual Activity  ?Alcohol Use Yes  ?   ?Social History  ? ?Substance and Sexual Activity  ?Drug Use Never  ?  ?Social History  ? ?Socioeconomic History  ? Marital status: Single  ?  Spouse name: Not on file  ? Number of children: Not on file  ? Years of education: Not on file  ? Highest education level: Not on file  ?Occupational History  ? Not on file  ?Tobacco Use  ? Smoking status: Every Day  ? Smokeless tobacco: Current  ?Substance and Sexual Activity  ? Alcohol use: Yes  ? Drug use: Never  ? Sexual activity: Not on file  ?Other Topics Concern  ? Not on file  ?Social History Narrative  ? Not on file  ? ?Social Determinants of Health  ? ?Financial Resource Strain:  Not on file  ?Food Insecurity: Not on file  ?Transportation Needs: Not on file  ?Physical Activity: Not on file  ?Stress: Not on file  ?Social Connections: Not on file  ? ?SDOH:  ?SDOH Screenings  ? ?Alcohol Screen: Not on file  ?Depression (PHQ2-9): Medium Risk  ? PHQ-2 Score: 18  ?Financial Resource Strain: Not on file  ?Food Insecurity: Not on file  ?Housing: Not on file  ?Physical Activity: Not on file  ?Social Connections: Not on file  ?Stress: Not on file   ?Tobacco Use: Not on file  ?Transportation Needs: Not on file  ? ? ?Tobacco Cessation:  N/A, patient does not currently use tobacco products ? ?Current Medications:  ?Current Facility-Administered Medications  ?Medication Dose Route Frequency Provider Last Rate Last Admin  ? acetaminophen (TYLENOL) tablet 650 mg  650 mg Oral Q6H PRN Sindy Guadeloupe, NP   650 mg at 01/31/22 0921  ? albuterol (VENTOLIN HFA) 108 (90 Base) MCG/ACT inhaler 1-2 puff  1-2 puff Inhalation Q4H PRN Lauree Chandler, NP      ? alum & mag hydroxide-simeth (MAALOX/MYLANTA) 200-200-20 MG/5ML suspension 30 mL  30 mL Oral Q4H PRN Sindy Guadeloupe, NP      ? citalopram (CELEXA) tablet 20 mg  20 mg Oral Daily Lauree Chandler, NP   20 mg at 01/31/22 2703  ? hydrochlorothiazide (HYDRODIURIL) tablet 12.5 mg  12.5 mg Oral Daily Sindy Guadeloupe, NP   12.5 mg at 01/31/22 5009  ? hydrOXYzine (ATARAX) tablet 25 mg  25 mg Oral TID PRN Sindy Guadeloupe, NP      ? magnesium hydroxide (MILK OF MAGNESIA) suspension 30 mL  30 mL Oral Daily PRN Sindy Guadeloupe, NP      ? ?Current Outpatient Medications  ?Medication Sig Dispense Refill  ? albuterol (VENTOLIN HFA) 108 (90 Base) MCG/ACT inhaler Inhale 1-2 puffs into the lungs every 4 (four) hours as needed for shortness of breath.    ? ibuprofen (ADVIL) 200 MG tablet Take 400 mg by mouth every 6 (six) hours as needed for headache.    ? [START ON 02/01/2022] citalopram (CELEXA) 20 MG tablet Take 1 tablet (20 mg total) by mouth daily. 30 tablet 0  ? hydrochlorothiazide (MICROZIDE) 12.5 MG capsule Take 1 capsule (12.5 mg total) by mouth every morning. 30 capsule 0  ? hydrOXYzine (ATARAX) 25 MG tablet Take 1 tablet (25 mg total) by mouth 3 (three) times daily as needed for anxiety. 30 tablet 0  ? ? ?PTA Medications: (Not in a hospital admission) ? ? ?Musculoskeletal  ?Strength & Muscle Tone: within normal limits ?Gait & Station: normal ?Patient leans: N/A ? ?Psychiatric Specialty Exam  ?Presentation  ?General Appearance:  Appropriate for Environment; Casual ? ?Eye Contact:Good ? ?Speech:Clear and Coherent; Normal Rate ? ?Speech Volume:Normal ? ?Handedness:Ambidextrous ? ? ?Mood and Affect  ?Mood:-- ("good") ? ?Affect:Appropriate; Congruent ? ? ?Thought Process  ?Thought Processes:Coherent; Goal Directed; Linear ? ?Descriptions of Associations:Intact ? ?Orientation:Full (Time, Place and Person) ? ?Thought Content:WDL; Logical ?   ? Hallucinations:Hallucinations: None ? ?Ideas of Reference:None ? ?Suicidal Thoughts:Suicidal Thoughts: No ? ?Homicidal Thoughts:Homicidal Thoughts: No ? ? ?Sensorium  ?Memory:Immediate Good; Recent Good; Remote Good ? ?Judgment:Good ? ?Insight:Good ? ? ?Executive Functions  ?Concentration:Good ? ?Attention Span:Good ? ?Recall:Good ? ?Fund of Knowledge:Good ? ?Language:Good ? ? ?Psychomotor Activity  ?Psychomotor Activity:Psychomotor Activity: Normal ? ? ?Assets  ?Assets:Communication Skills; Desire for Improvement; Resilience; Social Support; Talents/Skills; Transportation; Vocational/Educational; Housing; Financial Resources/Insurance ? ? ?Sleep  ?Sleep:Sleep: Fair ?Number  of Hours of Sleep: 3 ? ? ?No data recorded ? ?Physical Exam  ?Physical Exam ?Constitutional:   ?   Appearance: Normal appearance. She is normal weight.  ?HENT:  ?   Head: Normocephalic and atraumatic.  ?Eyes:  ?   Extraocular Movements: Extraocular movements intact.  ?   Conjunctiva/sclera: Conjunctivae normal.  ?Cardiovascular:  ?   Rate and Rhythm: Normal rate and regular rhythm.  ?   Heart sounds: Normal heart sounds.  ?Pulmonary:  ?   Effort: Pulmonary effort is normal.  ?   Breath sounds: Normal breath sounds.  ?Neurological:  ?   Mental Status: She is alert and oriented to person, place, and time.  ? ?Review of Systems  ?Constitutional:  Negative for chills and fever.  ?HENT:  Negative for hearing loss.   ?Eyes:  Negative for discharge and redness.  ?Respiratory:  Negative for cough.   ?Cardiovascular:  Negative for chest pain.   ?Gastrointestinal:  Negative for abdominal pain.  ?Musculoskeletal:  Negative for myalgias.  ?Neurological:  Positive for headaches.  ?Psychiatric/Behavioral:  Negative for hallucinations, substance abuse and suici

## 2022-01-31 NOTE — Discharge Instructions (Signed)

## 2022-01-31 NOTE — ED Notes (Signed)
Snacks given 

## 2022-01-31 NOTE — ED Notes (Signed)
Pt sleeping@this time. Breathing even and unlabored. Will continue to monitor for safety 

## 2022-01-31 NOTE — ED Notes (Signed)
Patient alert and oriented x4. Sitting up in dining area watching TV. No distress noted. Ate breakfast. Will continue to monitor. ?

## 2022-02-15 ENCOUNTER — Ambulatory Visit (INDEPENDENT_AMBULATORY_CARE_PROVIDER_SITE_OTHER): Payer: BC Managed Care – PPO | Admitting: Adult Health

## 2022-02-15 ENCOUNTER — Encounter: Payer: Self-pay | Admitting: Adult Health

## 2022-02-15 VITALS — BP 137/84 | HR 73 | Ht 70.0 in | Wt 341.0 lb

## 2022-02-15 DIAGNOSIS — F331 Major depressive disorder, recurrent, moderate: Secondary | ICD-10-CM | POA: Diagnosis not present

## 2022-02-15 DIAGNOSIS — F410 Panic disorder [episodic paroxysmal anxiety] without agoraphobia: Secondary | ICD-10-CM | POA: Diagnosis not present

## 2022-02-15 DIAGNOSIS — F411 Generalized anxiety disorder: Secondary | ICD-10-CM | POA: Diagnosis not present

## 2022-02-15 DIAGNOSIS — G4700 Insomnia, unspecified: Secondary | ICD-10-CM | POA: Diagnosis not present

## 2022-02-15 MED ORDER — CITALOPRAM HYDROBROMIDE 20 MG PO TABS: 20.0000 mg | ORAL_TABLET | Freq: Every day | ORAL | 2 refills | Status: DC

## 2022-02-15 MED ORDER — TRAZODONE HCL 50 MG PO TABS: 50.0000 mg | ORAL_TABLET | Freq: Every day | ORAL | 2 refills | Status: DC

## 2022-02-15 NOTE — Progress Notes (Signed)
[facility] MD/PA/NP Initial Note  02/15/2022 12:03 PM Donna Lloyd  MRN:  [id number]  Chief Complaint:   HPI:   Patient seen today for initial psychiatric evaluation.  Recent seen at the [facility] - suicidal thoughts. Collateral reviewed.  Describes mood today as "ok". Pleasant. Denies tearfulness. Mood symptoms - reports decreased depression. Feels irritable at times. More anxious overall. Reports panic attacks - last one 2 weeks ago. Mood is consistent. Recently seen at [facility] for mood decompensation. Stayed 2 nights and was started on Celexa and Hydroxyzine. Stating "I am feeling better". Does not want to make any changes to the Celexa at this time. Does not feel like the Hydroxyzine is beneficial for sleep.  Stable interest and motivation. Taking medications as prescribed.  Energy levels - "middle of the road". Active, does not have a regular exercise routine - "starting to get back to it". Enjoys some usual interests and activities. Lives with boyfriend of 1.5 years. Has an 13 year old daughter - shares custody. Spending time with family. Appetite adequate. Weight loss - 341 pounds - 70". Sleeping difficulties. Averages 3 to 4 hours. Focus and concentration stable. Completing tasks. Managing aspects of household. 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 Denies SI or HI.  Denies AH or VH. History of self harm - cutting. 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 Consumes alcohol 3 drinks a week x 2 days a week.   Previous medication trials: Zoloft  Visit Diagnosis:    ICD-10-CM   1. Generalized anxiety disorder  F41.1 citalopram (CELEXA) 20 MG tablet    2. Major depressive disorder, recurrent episode, moderate (HCC)  F33.1 citalopram (CELEXA) 20 MG tablet    3. Panic attacks  F41.0 citalopram (CELEXA) 20 MG tablet    4. Insomnia, unspecified type  G47.00 traZODone (DESYREL) 50 MG tablet      Past Psychiatric History: Reports two previous admission. History of substance  abuse - drug free x 4 years.  Past Medical History:  Past Medical History:  Diagnosis Date   Allergy    Asthma    Depression    No past surgical history on file.  Family Psychiatric History: Family history of mental illness.   Family History: No family history on file.  Social History:  Social History   Socioeconomic History   Marital status: Single    Spouse name: Not on file   Number of children: Not on file   Years of education: Not on file   Highest education level: Not on file  Occupational History   Not on file  Tobacco Use   Smoking status: Every Day   Smokeless tobacco: Current  Substance and Sexual Activity   Alcohol use: Yes   Drug use: Never   Sexual activity: Not on file  Other Topics Concern   Not on file  Social History Narrative   Not on file   Social Determinants of Health   Financial Resource Strain: Not on file  Food Insecurity: Not on file  Transportation Needs: Not on file  Physical Activity: Not on file  Stress: Not on file  Social Connections: Not on file    Allergies:  Allergies  Allergen Reactions   Latex Hives   Penicillins Hives    Metabolic Disorder Labs: Lab Results  Component Value Date   HGBA1C 5.1 01/29/2022   MPG 99.67 01/29/2022   No results found for: PROLACTIN No results found for: CHOL, TRIG, HDL, CHOLHDL, VLDL, LDLCALC Lab  Results  Component Value Date   TSH 2.827 01/29/2022   TSH 2.484 Test methodology is 3rd generation TSH 08/05/2009    Therapeutic Level Labs: No results found for: LITHIUM No results found for: VALPROATE No components found for:  CBMZ  Current Medications: Current Outpatient Medications  Medication Sig Dispense Refill   traZODone (DESYREL) 50 MG tablet Take 1 tablet (50 mg total) by mouth at bedtime. 30 tablet 2   albuterol (VENTOLIN HFA) 108 (90 Base) MCG/ACT inhaler Inhale 1-2 puffs into the lungs every 4 (four) hours as needed for shortness of breath.     citalopram (CELEXA) 20 MG  tablet Take 1 tablet (20 mg total) by mouth daily. 30 tablet 2   hydrochlorothiazide (MICROZIDE) 12.5 MG capsule Take 1 capsule (12.5 mg total) by mouth every morning. 30 capsule 0   hydrOXYzine (ATARAX) 25 MG tablet Take 1 tablet (25 mg total) by mouth 3 (three) times daily as needed for anxiety. 30 tablet 0   ibuprofen (ADVIL) 200 MG tablet Take 400 mg by mouth every 6 (six) hours as needed for headache.     No current facility-administered medications for this visit.    Medication Side Effects: none  Orders placed this visit:  No orders of the defined types were placed in this encounter.   Psychiatric Specialty Exam:  Review of Systems  Musculoskeletal:  Negative for gait problem.  Neurological:  Negative for tremors.  Psychiatric/Behavioral:         Please refer to HPI   Blood pressure 137/84, pulse 73, height 5\' 10"  (1.778 m), weight (!) 341 lb (154.7 kg).Body mass index is 48.93 kg/m.  General Appearance: Casual and Neat  Eye Contact:  Good  Speech:  Clear and Coherent and Normal Rate  Volume:  Normal  Mood:  Euthymic  Affect:  Appropriate and Congruent  Thought Process:  Coherent and Descriptions of Associations: Intact  Orientation:  Full (Time, Place, and Person)  Thought Content: Logical   Suicidal Thoughts:  No  Homicidal Thoughts:  No  Memory:  WNL  Judgement:  Good  Insight:  Good  Psychomotor Activity:  Normal  Concentration:  Concentration: Good and Attention Span: Good  Recall:  Good  Fund of Knowledge: Good  Language: Good  Assets:  Communication Skills Desire for Improvement Financial Resources/Insurance Housing Intimacy Leisure Time Physical Health Resilience Social Support Talents/Skills Transportation Vocational/Educational  ADL's:  Intact  Cognition: WNL  Prognosis:  Good   Screenings:   MDQ  PHQ2-9    Flowsheet Row ED from 01/29/2022 in [facility]  PHQ-2 Total Score 3  PHQ-9 Total Score 18       Flowsheet Row ED from 01/29/2022 in [facility]  C-SSRS RISK CATEGORY High Risk      Receiving Psychotherapy: No   Treatment Plan/Recommendations:   Plan:  PDMP reviewed  Celexa 20mg  daily x 3 weeks. Vistaril 25mg  TID  Time spent with patient was minutes. Greater than 50% of face to face time with patient was spent on counseling and coordination of care.    RTC 4 weeks  Patient advised to contact office with any questions, adverse effects, or acute worsening in signs and symptoms.    [facility], NP

## 2022-03-08 ENCOUNTER — Telehealth: Payer: Self-pay | Admitting: Adult Health

## 2022-03-08 DIAGNOSIS — F410 Panic disorder [episodic paroxysmal anxiety] without agoraphobia: Secondary | ICD-10-CM

## 2022-03-08 DIAGNOSIS — F411 Generalized anxiety disorder: Secondary | ICD-10-CM

## 2022-03-08 DIAGNOSIS — F331 Major depressive disorder, recurrent, moderate: Secondary | ICD-10-CM

## 2022-03-08 MED ORDER — CITALOPRAM HYDROBROMIDE 20 MG PO TABS: 20.0000 mg | ORAL_TABLET | Freq: Every day | ORAL | 2 refills | Status: DC

## 2022-03-08 NOTE — Telephone Encounter (Signed)
Rx had been sent to print. Resent to pharmacy, confirmed receipt, notified patient.

## 2022-03-08 NOTE — Telephone Encounter (Signed)
Patient lvm stating she is having difficulties filling her Celexa 20 mg at the [facility]. Patient stated she only has 3 doses left. A follow up appointment is scheduled for 6/23.

## 2022-03-15 ENCOUNTER — Encounter: Payer: Self-pay | Admitting: Adult Health

## 2022-03-15 ENCOUNTER — Ambulatory Visit: Payer: BC Managed Care – PPO | Admitting: Adult Health

## 2022-03-15 DIAGNOSIS — F331 Major depressive disorder, recurrent, moderate: Secondary | ICD-10-CM

## 2022-03-15 DIAGNOSIS — G4700 Insomnia, unspecified: Secondary | ICD-10-CM

## 2022-03-15 DIAGNOSIS — F410 Panic disorder [episodic paroxysmal anxiety] without agoraphobia: Secondary | ICD-10-CM | POA: Diagnosis not present

## 2022-03-15 DIAGNOSIS — F411 Generalized anxiety disorder: Secondary | ICD-10-CM | POA: Diagnosis not present

## 2022-03-15 MED ORDER — PROPRANOLOL HCL 10 MG PO TABS: 10.0000 mg | ORAL_TABLET | Freq: Three times a day (TID) | ORAL | 2 refills | Status: DC

## 2022-03-15 MED ORDER — ESZOPICLONE 2 MG PO TABS: 2.0000 mg | ORAL_TABLET | Freq: Every evening | ORAL | 2 refills | Status: DC | PRN

## 2022-03-15 MED ORDER — CITALOPRAM HYDROBROMIDE 40 MG PO TABS: 40.0000 mg | ORAL_TABLET | Freq: Every day | ORAL | 2 refills | Status: DC

## 2022-04-04 DIAGNOSIS — H6693 Otitis media, unspecified, bilateral: Secondary | ICD-10-CM | POA: Diagnosis not present

## 2022-04-04 DIAGNOSIS — H60333 Swimmer's ear, bilateral: Secondary | ICD-10-CM | POA: Diagnosis not present

## 2022-04-05 ENCOUNTER — Encounter: Payer: Self-pay | Admitting: Adult Health

## 2022-04-05 ENCOUNTER — Ambulatory Visit (INDEPENDENT_AMBULATORY_CARE_PROVIDER_SITE_OTHER): Payer: BC Managed Care – PPO | Admitting: Adult Health

## 2022-04-05 DIAGNOSIS — F410 Panic disorder [episodic paroxysmal anxiety] without agoraphobia: Secondary | ICD-10-CM | POA: Diagnosis not present

## 2022-04-05 DIAGNOSIS — F411 Generalized anxiety disorder: Secondary | ICD-10-CM | POA: Diagnosis not present

## 2022-04-05 DIAGNOSIS — G4700 Insomnia, unspecified: Secondary | ICD-10-CM | POA: Diagnosis not present

## 2022-04-05 DIAGNOSIS — F331 Major depressive disorder, recurrent, moderate: Secondary | ICD-10-CM | POA: Diagnosis not present

## 2022-04-05 MED ORDER — ESZOPICLONE 3 MG PO TABS: 3.0000 mg | ORAL_TABLET | Freq: Every evening | ORAL | 2 refills | Status: DC | PRN

## 2022-04-05 MED ORDER — BUSPIRONE HCL 10 MG PO TABS: 10.0000 mg | ORAL_TABLET | Freq: Three times a day (TID) | ORAL | 2 refills | Status: DC

## 2022-04-05 NOTE — Progress Notes (Signed)
Donna Lloyd [id number] September 04, 1991 30 y.o.  Subjective:   Patient ID:  Donna Lloyd is a 31 y.o. (DOB [birth year]) female.  Chief Complaint: No chief complaint on file.   HPI Donna Lloyd presents to the office today for follow-up of MDD, GAD, panic attacks, and insomnia.  Describes mood today as "ok". Pleasant. Denies tearfulness. Mood symptoms - denies depression. Feels anxious - "all the time". Increased irritability - short tempered - quick to react to little things that shouldn't be an issue. Reports panic attacks every other day - "a really bad one last Saturday at home in the shower". Mood is more consistent - having more better days. Taking medications as prescribed and feels like they are helpful. Varying interest and motivation. Taking medications as prescribed.  Energy levels improved. Active, does not have a regular exercise routine - walking. Enjoys some usual interests and activities. Lives with boyfriend. Has an 46 year old daughter - shares custody. Spending time with family. Appetite adequate. Weight loss - 227 from 331 pounds - 70". Sleeping difficulties. Averages 5 to 6 hours with addition of Lunesta. Focus and concentration difficulties - "still spacing out". Completing tasks. Managing aspects of household. 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 Denies SI or HI.  Denies AH or VH. History of self harm - cutting. 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 Denies alcohol use.  Previous medication trials: Zoloft    Review of Systems:  Review of Systems  Musculoskeletal:  Negative for gait problem.  Neurological:  Negative for tremors.  Psychiatric/Behavioral:         Please refer to HPI    Medications: I have reviewed the patient's current medications.  Current Outpatient Medications  Medication Sig Dispense Refill   busPIRone (BUSPAR) 10 MG tablet Take 1 tablet (10 mg total) by mouth 3 (three) times daily. 90 tablet 2   albuterol (VENTOLIN HFA) 108 (90  Base) MCG/ACT inhaler Inhale 1-2 puffs into the lungs every 4 (four) hours as needed for shortness of breath.     citalopram (CELEXA) 40 MG tablet Take 1 tablet (40 mg total) by mouth daily. 30 tablet 2   eszopiclone 3 MG TABS Take 1 tablet (3 mg total) by mouth at bedtime as needed for sleep. Take immediately before bedtime 30 tablet 2   hydrochlorothiazide (MICROZIDE) 12.5 MG capsule Take 1 capsule (12.5 mg total) by mouth every morning. 30 capsule 0   hydrOXYzine (ATARAX) 25 MG tablet Take 1 tablet (25 mg total) by mouth 3 (three) times daily as needed for anxiety. 30 tablet 0   ibuprofen (ADVIL) 200 MG tablet Take 400 mg by mouth every 6 (six) hours as needed for headache.     No current facility-administered medications for this visit.    Medication Side Effects: None  Allergies:  Allergies  Allergen Reactions   Latex Hives   Penicillins Hives    Past Medical History:  Diagnosis Date   Allergy    Asthma    Depression     Past Medical History, Surgical history, Social history, and Family history were reviewed and updated as appropriate.   Please see review of systems for further details on the patient's review from today.   Objective:   Physical Exam:  There were no vitals taken for this visit.  Physical Exam Constitutional:      General: She is not in acute distress. Musculoskeletal:        General: No deformity.  Neurological:  Mental Status: She is alert and oriented to person, place, and time.     Coordination: Coordination normal.  Psychiatric:        Attention and Perception: Attention and perception normal. She does not perceive auditory or visual hallucinations.        Mood and Affect: Mood normal. Mood is not anxious or depressed. Affect is not labile, blunt, angry or inappropriate.        Speech: Speech normal.        Behavior: Behavior normal.        Thought Content: Thought content normal. Thought content is not paranoid or delusional. Thought  content does not include homicidal or suicidal ideation. Thought content does not include homicidal or suicidal plan.        Cognition and Memory: Cognition and memory normal.        Judgment: Judgment normal.     Comments: Insight intact     Lab Review:     Component Value Date/Time   NA 141 01/29/2022 2052   K 3.9 01/29/2022 2052   CL 108 01/29/2022 2052   CO2 26 01/29/2022 2052   GLUCOSE 82 01/29/2022 2052   BUN 14 01/29/2022 2052   CREATININE 0.72 01/29/2022 2052   CALCIUM 9.2 01/29/2022 2052   PROT 6.5 01/29/2022 2052   ALBUMIN 4.0 01/29/2022 2052   AST 21 01/29/2022 2052   ALT 34 01/29/2022 2052   ALKPHOS 57 01/29/2022 2052   BILITOT 0.9 01/29/2022 2052   GFRNONAA >60 01/29/2022 2052   GFRAA  08/05/2009 0840    NOT CALCULATED        The eGFR has been calculated using the MDRD equation. This calculation has not been validated in all clinical situations. eGFR's persistently <60 mL/min signify possible Chronic Kidney Disease.       Component Value Date/Time   WBC 10.4 01/29/2022 2052   RBC 4.80 01/29/2022 2052   HGB 13.7 01/29/2022 2052   HCT 42.4 01/29/2022 2052   PLT 246 01/29/2022 2052   MCV 88.3 01/29/2022 2052   MCH 28.5 01/29/2022 2052   MCHC 32.3 01/29/2022 2052   RDW 12.9 01/29/2022 2052   LYMPHSABS 3.5 01/29/2022 2052   MONOABS 0.6 01/29/2022 2052   EOSABS 0.2 01/29/2022 2052   BASOSABS 0.1 01/29/2022 2052    No results found for: "POCLITH", "LITHIUM"   No results found for: "PHENYTOIN", "PHENOBARB", "VALPROATE", "CBMZ"   .res Assessment: Plan:    Plan:  PDMP reviewed  Celexa $Remov'40mg'pHIUVC$  at bedtime Lunesta $RemoveBeforeD'3mg'VLHSiPvSqRolso$  daily Add Buspar $RemoveBe'10mg'HHcIqLXJc$  TID - 1/2 TID x 7 days, then increase to one TID.  D/C Propranolol $RemoveBeforeD'10mg'WmAkfOPdMwkavB$  TID   Time spent with patient was minutes. Greater than 50% of face to face time with patient was spent on counseling and coordination of care.    RTC 4 weeks  Patient advised to contact office with any questions, adverse effects, or acute  worsening in signs and symptoms. Diagnoses and all orders for this visit:  Generalized anxiety disorder -     busPIRone (BUSPAR) 10 MG tablet; Take 1 tablet (10 mg total) by mouth 3 (three) times daily.  Insomnia, unspecified type -     eszopiclone 3 MG TABS; Take 1 tablet (3 mg total) by mouth at bedtime as needed for sleep. Take immediately before bedtime  Major depressive disorder, recurrent episode, moderate (HCC)  Panic attacks     Please see After Visit Summary for patient specific instructions.  No future appointments.  No orders of  the defined types were placed in this encounter.   -------------------------------

## 2022-05-03 ENCOUNTER — Ambulatory Visit (INDEPENDENT_AMBULATORY_CARE_PROVIDER_SITE_OTHER): Payer: BC Managed Care – PPO | Admitting: Adult Health

## 2022-05-03 ENCOUNTER — Encounter: Payer: Self-pay | Admitting: Adult Health

## 2022-05-03 DIAGNOSIS — F410 Panic disorder [episodic paroxysmal anxiety] without agoraphobia: Secondary | ICD-10-CM | POA: Diagnosis not present

## 2022-05-03 DIAGNOSIS — G4700 Insomnia, unspecified: Secondary | ICD-10-CM | POA: Diagnosis not present

## 2022-05-03 DIAGNOSIS — F411 Generalized anxiety disorder: Secondary | ICD-10-CM

## 2022-05-03 DIAGNOSIS — F331 Major depressive disorder, recurrent, moderate: Secondary | ICD-10-CM | POA: Diagnosis not present

## 2022-05-03 MED ORDER — LORAZEPAM 0.5 MG PO TABS: 0.5000 mg | ORAL_TABLET | Freq: Three times a day (TID) | ORAL | 2 refills | Status: DC

## 2022-05-03 NOTE — Progress Notes (Signed)
Donna Lloyd [id number] 06/11/1991 30 y.o.  Subjective:   Patient ID:  Donna Lloyd is a 31 y.o. (DOB [birth year]) female.  Chief Complaint: No chief complaint on file.   HPI Donna Lloyd presents to the office today for follow-up of MDD, GAD, panic attacks, and insomnia.  Describes mood today as "ok". Pleasant. Denies tearfulness. Mood symptoms - denies depression - improving. Continues to feel anxious - 75% of the time and irritability - 50% of the time. Feels short tempered - work and at home. Considering leaving her job and fins a at home position. Reports increased panic attacks - have gotten worse- hyperventilating and getting dizzy - has hit the floor and is not able to get up off the floor - last attack was 45 minutes. Trying to use techniques she's learned from mental health staff to help her. Mood is variable. Recently lost her sister. One of her friends lost her child. Feels like the antidepressant has helped with emotional regulation, but does not feel like the addition of Buspar has helped. Taking medications as prescribed and feels like they are helpful. Varying interest and motivation. Taking medications as prescribed.  Energy levels improved. Active, does not have a regular exercise routine - walking. Enjoys some usual interests and activities. Lives with boyfriend. Has an 51 year old daughter - shares custody. Spending time with family. Appetite adequate. Weight loss - 227 from 331 pounds - 70". Sleeping difficulties. Averages 5 to 6 hours with addition of Lunesta. Focus and concentration difficulties. Completing tasks. Managing aspects of household. 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 Denies SI or HI.  Denies AH or VH. History of self harm - cutting. 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 Denies alcohol use.  Previous medication trials: Zoloft  Review of Systems:  Review of Systems  Musculoskeletal:  Negative for gait problem.  Neurological:  Negative for  tremors.  Psychiatric/Behavioral:         Please refer to HPI    Medications: I have reviewed the patient's current medications.  Current Outpatient Medications  Medication Sig Dispense Refill   LORazepam (ATIVAN) 0.5 MG tablet Take 1 tablet (0.5 mg total) by mouth 3 (three) times daily. 90 tablet 2   albuterol (VENTOLIN HFA) 108 (90 Base) MCG/ACT inhaler Inhale 1-2 puffs into the lungs every 4 (four) hours as needed for shortness of breath.     busPIRone (BUSPAR) 10 MG tablet Take 1 tablet (10 mg total) by mouth 3 (three) times daily. 90 tablet 2   citalopram (CELEXA) 40 MG tablet Take 1 tablet (40 mg total) by mouth daily. 30 tablet 2   eszopiclone 3 MG TABS Take 1 tablet (3 mg total) by mouth at bedtime as needed for sleep. Take immediately before bedtime 30 tablet 2   hydrochlorothiazide (MICROZIDE) 12.5 MG capsule Take 1 capsule (12.5 mg total) by mouth every morning. 30 capsule 0   ibuprofen (ADVIL) 200 MG tablet Take 400 mg by mouth every 6 (six) hours as needed for headache.     No current facility-administered medications for this visit.    Medication Side Effects: None  Allergies:  Allergies  Allergen Reactions   Latex Hives   Penicillins Hives    Past Medical History:  Diagnosis Date   Allergy    Asthma    Depression     Past Medical History, Surgical history, Social history, and Family history were reviewed and updated as appropriate.   Please see review  of systems for further details on the patient's review from today.   Objective:   Physical Exam:  There were no vitals taken for this visit.  Physical Exam Constitutional:      General: She is not in acute distress. Musculoskeletal:        General: No deformity.  Neurological:     Mental Status: She is alert and oriented to person, place, and time.     Coordination: Coordination normal.  Psychiatric:        Attention and Perception: Attention and perception normal. She does not perceive auditory or  visual hallucinations.        Mood and Affect: Mood normal. Mood is not anxious or depressed. Affect is not labile, blunt, angry or inappropriate.        Speech: Speech normal.        Behavior: Behavior normal.        Thought Content: Thought content normal. Thought content is not paranoid or delusional. Thought content does not include homicidal or suicidal ideation. Thought content does not include homicidal or suicidal plan.        Cognition and Memory: Cognition and memory normal.        Judgment: Judgment normal.     Comments: Insight intact     Lab Review:     Component Value Date/Time   NA 141 01/29/2022 2052   K 3.9 01/29/2022 2052   CL 108 01/29/2022 2052   CO2 26 01/29/2022 2052   GLUCOSE 82 01/29/2022 2052   BUN 14 01/29/2022 2052   CREATININE 0.72 01/29/2022 2052   CALCIUM 9.2 01/29/2022 2052   PROT 6.5 01/29/2022 2052   ALBUMIN 4.0 01/29/2022 2052   AST 21 01/29/2022 2052   ALT 34 01/29/2022 2052   ALKPHOS 57 01/29/2022 2052   BILITOT 0.9 01/29/2022 2052   GFRNONAA >60 01/29/2022 2052   GFRAA  08/05/2009 0840    NOT CALCULATED        The eGFR has been calculated using the MDRD equation. This calculation has not been validated in all clinical situations. eGFR's persistently <60 mL/min signify possible Chronic Kidney Disease.       Component Value Date/Time   WBC 10.4 01/29/2022 2052   RBC 4.80 01/29/2022 2052   HGB 13.7 01/29/2022 2052   HCT 42.4 01/29/2022 2052   PLT 246 01/29/2022 2052   MCV 88.3 01/29/2022 2052   MCH 28.5 01/29/2022 2052   MCHC 32.3 01/29/2022 2052   RDW 12.9 01/29/2022 2052   LYMPHSABS 3.5 01/29/2022 2052   MONOABS 0.6 01/29/2022 2052   EOSABS 0.2 01/29/2022 2052   BASOSABS 0.1 01/29/2022 2052    No results found for: "POCLITH", "LITHIUM"   No results found for: "PHENYTOIN", "PHENOBARB", "VALPROATE", "CBMZ"   .res Assessment: Plan:    Plan:  PDMP reviewed  Celexa 19m at bedtime Lunesta 34mdaily Add Ativan 0.45m89mTID  D/C Buspar 30m345mD - 1/2 TID x 7 days, then increase to one TID. D/C Propranolol 30mg58m   Time spent with patient was minutes. Greater than 50% of face to face time with patient was spent on counseling and coordination of care.    RTC 4 weeks  Patient advised to contact office with any questions, adverse effects, or acute worsening in signs and symptoms. Diagnoses and all orders for this visit:  Insomnia, unspecified type  Generalized anxiety disorder -     LORazepam (ATIVAN) 0.5 MG tablet; Take 1 tablet (0.5 mg total) by  mouth 3 (three) times daily.  Major depressive disorder, recurrent episode, moderate (HCC)  Panic attacks -     LORazepam (ATIVAN) 0.5 MG tablet; Take 1 tablet (0.5 mg total) by mouth 3 (three) times daily.     Please see After Visit Summary for patient specific instructions.  No future appointments.  No orders of the defined types were placed in this encounter.   -------------------------------

## 2022-05-26 ENCOUNTER — Other Ambulatory Visit: Payer: Self-pay | Admitting: Adult Health

## 2022-05-26 DIAGNOSIS — F411 Generalized anxiety disorder: Secondary | ICD-10-CM

## 2022-05-26 DIAGNOSIS — F410 Panic disorder [episodic paroxysmal anxiety] without agoraphobia: Secondary | ICD-10-CM

## 2022-05-30 ENCOUNTER — Ambulatory Visit (INDEPENDENT_AMBULATORY_CARE_PROVIDER_SITE_OTHER): Payer: Self-pay | Admitting: Adult Health

## 2022-05-30 DIAGNOSIS — F410 Panic disorder [episodic paroxysmal anxiety] without agoraphobia: Secondary | ICD-10-CM

## 2022-05-30 DIAGNOSIS — G4700 Insomnia, unspecified: Secondary | ICD-10-CM

## 2022-05-30 DIAGNOSIS — F331 Major depressive disorder, recurrent, moderate: Secondary | ICD-10-CM

## 2022-05-30 DIAGNOSIS — F411 Generalized anxiety disorder: Secondary | ICD-10-CM

## 2022-05-30 NOTE — Progress Notes (Signed)
Patient no show appointment. ? ?

## 2022-06-06 ENCOUNTER — Telehealth: Payer: Self-pay | Admitting: Adult Health

## 2022-06-06 NOTE — Telephone Encounter (Signed)
Patient called in for refill for Eszopiclone 3mg . Ph: [phone] Appt 9/18 Pharmacy [facility] [address] [place]

## 2022-06-06 NOTE — Telephone Encounter (Signed)
Notified patient that RF available at pharmacy. On 7/14 Rx was sent in with 2 RF.

## 2022-06-10 ENCOUNTER — Telehealth: Payer: Self-pay | Admitting: Adult Health

## 2022-06-10 ENCOUNTER — Encounter: Payer: Self-pay | Admitting: Adult Health

## 2022-06-10 ENCOUNTER — Ambulatory Visit: Payer: BC Managed Care – PPO | Admitting: Adult Health

## 2022-06-10 DIAGNOSIS — F410 Panic disorder [episodic paroxysmal anxiety] without agoraphobia: Secondary | ICD-10-CM | POA: Diagnosis not present

## 2022-06-10 DIAGNOSIS — G4700 Insomnia, unspecified: Secondary | ICD-10-CM | POA: Diagnosis not present

## 2022-06-10 DIAGNOSIS — F411 Generalized anxiety disorder: Secondary | ICD-10-CM

## 2022-06-10 DIAGNOSIS — F331 Major depressive disorder, recurrent, moderate: Secondary | ICD-10-CM

## 2022-06-10 MED ORDER — ESZOPICLONE 3 MG PO TABS: 3.0000 mg | ORAL_TABLET | Freq: Every evening | ORAL | 2 refills | Status: DC | PRN

## 2022-06-10 MED ORDER — BUSPIRONE HCL 10 MG PO TABS: 10.0000 mg | ORAL_TABLET | Freq: Three times a day (TID) | ORAL | 2 refills | Status: DC

## 2022-06-10 MED ORDER — CITALOPRAM HYDROBROMIDE 40 MG PO TABS
40.0000 mg | ORAL_TABLET | Freq: Every day | ORAL | 2 refills | Status: DC
Start: 2022-06-10 — End: 2022-08-09

## 2022-06-10 NOTE — Progress Notes (Signed)
Donna Lloyd [id number] May 25, 1991 31 y.o.  Subjective:   Patient ID:  Donna Lloyd is a 31 y.o. (DOB 1991/03/23) female.  Chief Complaint: No chief complaint on file.   HPI Donna Lloyd presents to the office today for follow-up of MDD, GAD, panic attacks, and insomnia.  Accompanied by daughter.  Describes mood today as "ok". Pleasant. Denies tearfulness. Mood symptoms - reports increased anxiety, depression and irritability. Reports panic attacks. Mood is variable. Has been out of Lunesta for a week and her sleep has declined. Denies worry and rumination. Would like to restart the Buspar - felt like it was helpful. Increased stressors in the work setting. Considering leaving her job - looking for other opportunities. Taking medications as prescribed and feels like they are helpful. Varying interest and motivation. Taking medications as prescribed.  Energy levels improved. Active, does not have a regular exercise routine - walking. Enjoys some usual interests and activities. Lives with boyfriend. Has an 29 year old daughter - shares custody. Spending time with family. Appetite adequate. Weight loss - 227 pounds - 70". Sleeping difficulties. Averages 5 to 6 hours with Lunesta. Focus and concentration difficulties. Completing tasks. Managing aspects of household. 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 Denies SI or HI.  Denies AH or VH. History of self harm - cutting. 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 Denies alcohol use.  Previous medication trials: Zoloft      Review of Systems:  Review of Systems  Musculoskeletal:  Negative for gait problem.  Neurological:  Negative for tremors.  Psychiatric/Behavioral:         Please refer to HPI    Medications: I have reviewed the patient's current medications.  Current Outpatient Medications  Medication Sig Dispense Refill   albuterol (VENTOLIN HFA) 108 (90 Base) MCG/ACT inhaler Inhale 1-2 puffs into the lungs every 4  (four) hours as needed for shortness of breath.     busPIRone (BUSPAR) 10 MG tablet Take 1 tablet (10 mg total) by mouth 3 (three) times daily. 90 tablet 2   citalopram (CELEXA) 40 MG tablet Take 1 tablet (40 mg total) by mouth daily. 30 tablet 2   Eszopiclone 3 MG TABS Take 1 tablet (3 mg total) by mouth at bedtime as needed. Take immediately before bedtime 30 tablet 2   hydrochlorothiazide (MICROZIDE) 12.5 MG capsule Take 1 capsule (12.5 mg total) by mouth every morning. 30 capsule 0   ibuprofen (ADVIL) 200 MG tablet Take 400 mg by mouth every 6 (six) hours as needed for headache.     LORazepam (ATIVAN) 0.5 MG tablet Take 1 tablet (0.5 mg total) by mouth 3 (three) times daily. 90 tablet 2   No current facility-administered medications for this visit.    Medication Side Effects: None  Allergies:  Allergies  Allergen Reactions   Latex Hives   Penicillins Hives    Past Medical History:  Diagnosis Date   Allergy    Asthma    Depression     Past Medical History, Surgical history, Social history, and Family history were reviewed and updated as appropriate.   Please see review of systems for further details on the patient's review from today.   Objective:   Physical Exam:  There were no vitals taken for this visit.  Physical Exam Constitutional:      General: She is not in acute distress. Musculoskeletal:        General: No deformity.  Neurological:  Mental Status: She is alert and oriented to person, place, and time.     Coordination: Coordination normal.  Psychiatric:        Attention and Perception: Attention and perception normal. She does not perceive auditory or visual hallucinations.        Mood and Affect: Mood normal. Mood is not anxious or depressed. Affect is not labile, blunt, angry or inappropriate.        Speech: Speech normal.        Behavior: Behavior normal.        Thought Content: Thought content normal. Thought content is not paranoid or delusional.  Thought content does not include homicidal or suicidal ideation. Thought content does not include homicidal or suicidal plan.        Cognition and Memory: Cognition and memory normal.        Judgment: Judgment normal.     Comments: Insight intact     Lab Review:     Component Value Date/Time   NA 141 01/29/2022 2052   K 3.9 01/29/2022 2052   CL 108 01/29/2022 2052   CO2 26 01/29/2022 2052   GLUCOSE 82 01/29/2022 2052   BUN 14 01/29/2022 2052   CREATININE 0.72 01/29/2022 2052   CALCIUM 9.2 01/29/2022 2052   PROT 6.5 01/29/2022 2052   ALBUMIN 4.0 01/29/2022 2052   AST 21 01/29/2022 2052   ALT 34 01/29/2022 2052   ALKPHOS 57 01/29/2022 2052   BILITOT 0.9 01/29/2022 2052   GFRNONAA >60 01/29/2022 2052   GFRAA  08/05/2009 0840    NOT CALCULATED        The eGFR has been calculated using the MDRD equation. This calculation has not been validated in all clinical situations. eGFR's persistently <60 mL/min signify possible Chronic Kidney Disease.       Component Value Date/Time   WBC 10.4 01/29/2022 2052   RBC 4.80 01/29/2022 2052   HGB 13.7 01/29/2022 2052   HCT 42.4 01/29/2022 2052   PLT 246 01/29/2022 2052   MCV 88.3 01/29/2022 2052   MCH 28.5 01/29/2022 2052   MCHC 32.3 01/29/2022 2052   RDW 12.9 01/29/2022 2052   LYMPHSABS 3.5 01/29/2022 2052   MONOABS 0.6 01/29/2022 2052   EOSABS 0.2 01/29/2022 2052   BASOSABS 0.1 01/29/2022 2052    No results found for: "POCLITH", "LITHIUM"   No results found for: "PHENYTOIN", "PHENOBARB", "VALPROATE", "CBMZ"   .res Assessment: Plan:    Plan:  PDMP reviewed  Celexa 26m at bedtime Lunesta 31mdaily Ativan 0.38m838mID Restart Buspar 24m9mD - 1/2 TID x 7 days, then increase to one TID.  RTC 6 weeks  Patient advised to contact office with any questions, adverse effects, or acute worsening in signs and symptoms.  Discussed potential benefits, risk, and side effects of benzodiazepines to include potential risk of  tolerance and dependence, as well as possible drowsiness.  Advised patient not to drive if experiencing drowsiness and to take lowest possible effective dose to minimize risk of dependence and tolerance.    Diagnoses and all orders for this visit:  Major depressive disorder, recurrent episode, moderate (HCC) -     citalopram (CELEXA) 40 MG tablet; Take 1 tablet (40 mg total) by mouth daily.  Generalized anxiety disorder -     busPIRone (BUSPAR) 10 MG tablet; Take 1 tablet (10 mg total) by mouth 3 (three) times daily. -     citalopram (CELEXA) 40 MG tablet; Take 1 tablet (40 mg total) by  mouth daily.  Panic attacks -     citalopram (CELEXA) 40 MG tablet; Take 1 tablet (40 mg total) by mouth daily.  Insomnia, unspecified type -     Eszopiclone 3 MG TABS; Take 1 tablet (3 mg total) by mouth at bedtime as needed. Take immediately before bedtime     Please see After Visit Summary for patient specific instructions.  Future Appointments  Date Time Provider [place]  08/09/2022  8:40 AM Jalayna Josten, Berdie Ogren, NP [facility] None     No orders of the defined types were placed in this encounter.   -------------------------------

## 2022-06-10 NOTE — Telephone Encounter (Signed)
Patient called in requesting a letter for an emotional support animal for her dog. States that she has a registration but isn't enough for her apartment complex. Pls rtc [phone]

## 2022-06-11 NOTE — Telephone Encounter (Signed)
Noted. Letter complete.

## 2022-06-11 NOTE — Telephone Encounter (Signed)
Pt has an [place] terrier that is 33.29 years old.He helps by picking up on her anxiety and depression and comforting her.Please let me know if any other info is needed

## 2022-06-11 NOTE — Telephone Encounter (Signed)
Pt informed

## 2022-08-09 ENCOUNTER — Encounter: Payer: Self-pay | Admitting: Adult Health

## 2022-08-09 ENCOUNTER — Ambulatory Visit: Payer: BC Managed Care – PPO | Admitting: Adult Health

## 2022-08-09 DIAGNOSIS — G4700 Insomnia, unspecified: Secondary | ICD-10-CM | POA: Diagnosis not present

## 2022-08-09 DIAGNOSIS — F411 Generalized anxiety disorder: Secondary | ICD-10-CM

## 2022-08-09 DIAGNOSIS — F410 Panic disorder [episodic paroxysmal anxiety] without agoraphobia: Secondary | ICD-10-CM

## 2022-08-09 DIAGNOSIS — F331 Major depressive disorder, recurrent, moderate: Secondary | ICD-10-CM

## 2022-08-09 MED ORDER — BUSPIRONE HCL 10 MG PO TABS: 10.0000 mg | ORAL_TABLET | Freq: Three times a day (TID) | ORAL | 2 refills | Status: DC

## 2022-08-09 MED ORDER — ESZOPICLONE 3 MG PO TABS: 3.0000 mg | ORAL_TABLET | Freq: Every evening | ORAL | 2 refills | Status: DC | PRN

## 2022-08-09 MED ORDER — LORAZEPAM 0.5 MG PO TABS: 0.5000 mg | ORAL_TABLET | Freq: Three times a day (TID) | ORAL | 2 refills | Status: DC

## 2022-08-09 MED ORDER — CITALOPRAM HYDROBROMIDE 40 MG PO TABS: 40.0000 mg | ORAL_TABLET | Freq: Every day | ORAL | 2 refills | Status: DC

## 2022-08-09 NOTE — Progress Notes (Signed)
Donna Lloyd [id number] [birth year] 31 y.o.  Subjective:   Patient ID:  Donna Lloyd is a 31 y.o. (DOB [birth year]) female.  Chief Complaint: No chief complaint on file.   HPI Donna Lloyd presents to the office today for follow-up of MDD, GAD, panic attacks, and insomnia.  Accompanied by daughter.  Describes mood today as "ok". Pleasant. Denies tearfulness. Mood symptoms - reports decreased anxiety, depression and irritability. Denies worry and rumination. Reports decreased panic attacks. Mood is more consistent. Stating "I feel like I'm doing a lot better than I was". Feels like medications are helpful. Stable interest and motivation. Taking medications as prescribed.  Energy levels improved. Active, does not have a regular exercise routine - walking daily. Enjoys some usual interests and activities. Lives with boyfriend. Has an 45 year old daughter - shares custody. Spending time with family. Appetite adequate. Weight stable- 327 pounds - 70". Sleeping difficulties. Averages 6 to 7 hours with Lunesta. Focus and concentration difficulties. Completing tasks. Managing aspects of household. 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 Denies SI or HI.  Denies AH or VH. History of self harm - cutting. 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 Denies alcohol use.  Previous medication trials: Zoloft      Review of Systems:  Review of Systems  Musculoskeletal:  Negative for gait problem.  Neurological:  Negative for tremors.  Psychiatric/Behavioral:         Please refer to HPI    Medications: I have reviewed the patient's current medications.  Current Outpatient Medications  Medication Sig Dispense Refill   albuterol (VENTOLIN HFA) 108 (90 Base) MCG/ACT inhaler Inhale 1-2 puffs into the lungs every 4 (four) hours as needed for shortness of breath.     busPIRone (BUSPAR) 10 MG tablet Take 1 tablet (10 mg total) by mouth 3 (three) times daily. 90 tablet 2   citalopram (CELEXA)  40 MG tablet Take 1 tablet (40 mg total) by mouth daily. 30 tablet 2   Eszopiclone 3 MG TABS Take 1 tablet (3 mg total) by mouth at bedtime as needed. Take immediately before bedtime 30 tablet 2   hydrochlorothiazide (MICROZIDE) 12.5 MG capsule Take 1 capsule (12.5 mg total) by mouth every morning. 30 capsule 0   LORazepam (ATIVAN) 0.5 MG tablet Take 1 tablet (0.5 mg total) by mouth 3 (three) times daily. 90 tablet 2   No current facility-administered medications for this visit.    Medication Side Effects: None  Allergies:  Allergies  Allergen Reactions   Latex Hives   Penicillins Hives    Past Medical History:  Diagnosis Date   Allergy    Asthma    Depression     Past Medical History, Surgical history, Social history, and Family history were reviewed and updated as appropriate.   Please see review of systems for further details on the patient's review from today.   Objective:   Physical Exam:  There were no vitals taken for this visit.  Physical Exam Constitutional:      General: She is not in acute distress. Musculoskeletal:        General: No deformity.  Neurological:     Mental Status: She is alert and oriented to person, place, and time.     Coordination: Coordination normal.  Psychiatric:        Attention and Perception: Attention and perception normal. She does not perceive auditory or visual hallucinations.        Mood and Affect:  Mood normal. Mood is not anxious or depressed. Affect is not labile, blunt, angry or inappropriate.        Speech: Speech normal.        Behavior: Behavior normal.        Thought Content: Thought content normal. Thought content is not paranoid or delusional. Thought content does not include homicidal or suicidal ideation. Thought content does not include homicidal or suicidal plan.        Cognition and Memory: Cognition and memory normal.        Judgment: Judgment normal.     Comments: Insight intact     Lab Review:      Component Value Date/Time   NA 141 01/29/2022 2052   K 3.9 01/29/2022 2052   CL 108 01/29/2022 2052   CO2 26 01/29/2022 2052   GLUCOSE 82 01/29/2022 2052   BUN 14 01/29/2022 2052   CREATININE 0.72 01/29/2022 2052   CALCIUM 9.2 01/29/2022 2052   PROT 6.5 01/29/2022 2052   ALBUMIN 4.0 01/29/2022 2052   AST 21 01/29/2022 2052   ALT 34 01/29/2022 2052   ALKPHOS 57 01/29/2022 2052   BILITOT 0.9 01/29/2022 2052   GFRNONAA >60 01/29/2022 2052   GFRAA  08/05/2009 0840    NOT CALCULATED        The eGFR has been calculated using the MDRD equation. This calculation has not been validated in all clinical situations. eGFR's persistently <60 mL/min signify possible Chronic Kidney Disease.       Component Value Date/Time   WBC 10.4 01/29/2022 2052   RBC 4.80 01/29/2022 2052   HGB 13.7 01/29/2022 2052   HCT 42.4 01/29/2022 2052   PLT 246 01/29/2022 2052   MCV 88.3 01/29/2022 2052   MCH 28.5 01/29/2022 2052   MCHC 32.3 01/29/2022 2052   RDW 12.9 01/29/2022 2052   LYMPHSABS 3.5 01/29/2022 2052   MONOABS 0.6 01/29/2022 2052   EOSABS 0.2 01/29/2022 2052   BASOSABS 0.1 01/29/2022 2052    No results found for: "POCLITH", "LITHIUM"   No results found for: "PHENYTOIN", "PHENOBARB", "VALPROATE", "CBMZ"   .res Assessment: Plan:    Plan:  PDMP reviewed  Celexa 56m at bedtime Lunesta 318mdaily Ativan 0.56m63mID Buspar 65m43mD.  RTC 3 months  Patient advised to contact office with any questions, adverse effects, or acute worsening in signs and symptoms.  Discussed potential benefits, risk, and side effects of benzodiazepines to include potential risk of tolerance and dependence, as well as possible drowsiness.  Advised patient not to drive if experiencing drowsiness and to take lowest possible effective dose to minimize risk of dependence and tolerance.  Diagnoses and all orders for this visit:  Generalized anxiety disorder  Panic attacks  Insomnia, unspecified  type  Major depressive disorder, recurrent episode, moderate (HCC)[place]  Please see After Visit Summary for patient specific instructions.  No future appointments.  No orders of the defined types were placed in this encounter.   -------------------------------

## 2022-10-29 ENCOUNTER — Telehealth: Payer: Self-pay | Admitting: Adult Health

## 2022-10-29 ENCOUNTER — Other Ambulatory Visit: Payer: Self-pay

## 2022-10-29 DIAGNOSIS — F410 Panic disorder [episodic paroxysmal anxiety] without agoraphobia: Secondary | ICD-10-CM

## 2022-10-29 DIAGNOSIS — F411 Generalized anxiety disorder: Secondary | ICD-10-CM

## 2022-10-29 DIAGNOSIS — F331 Major depressive disorder, recurrent, moderate: Secondary | ICD-10-CM

## 2022-10-29 MED ORDER — CITALOPRAM HYDROBROMIDE 40 MG PO TABS: 40.0000 mg | ORAL_TABLET | Freq: Every day | ORAL | 2 refills | Status: DC

## 2022-10-29 NOTE — Telephone Encounter (Signed)
Rx sent 

## 2022-10-29 NOTE — Telephone Encounter (Signed)
Next appt is 2/15. Donna Lloyd states she was given an RX for Citalopram 40 mg but has RX was for 20 mg. She has about 7 left. Can she got a refill for the Citalopram 40 mg called to:  [facility] [place], [place] AT [facility] [place]     Phone: [phone]  Fax: [phone]

## 2022-11-03 ENCOUNTER — Other Ambulatory Visit: Payer: Self-pay | Admitting: Adult Health

## 2022-11-03 DIAGNOSIS — F411 Generalized anxiety disorder: Secondary | ICD-10-CM

## 2022-11-07 ENCOUNTER — Ambulatory Visit: Payer: BC Managed Care – PPO | Admitting: Adult Health

## 2022-11-25 DIAGNOSIS — R0600 Dyspnea, unspecified: Secondary | ICD-10-CM | POA: Diagnosis not present

## 2022-11-25 DIAGNOSIS — J101 Influenza due to other identified influenza virus with other respiratory manifestations: Secondary | ICD-10-CM | POA: Diagnosis not present

## 2022-12-03 ENCOUNTER — Other Ambulatory Visit: Payer: Self-pay | Admitting: Adult Health

## 2022-12-03 DIAGNOSIS — F411 Generalized anxiety disorder: Secondary | ICD-10-CM

## 2022-12-03 NOTE — Telephone Encounter (Signed)
Please call to schedule an appt  

## 2022-12-04 NOTE — Telephone Encounter (Signed)
Pt is scheduled for 4/1 

## 2022-12-13 DIAGNOSIS — G4700 Insomnia, unspecified: Secondary | ICD-10-CM | POA: Diagnosis not present

## 2022-12-13 DIAGNOSIS — I10 Essential (primary) hypertension: Secondary | ICD-10-CM | POA: Diagnosis not present

## 2022-12-13 DIAGNOSIS — G43909 Migraine, unspecified, not intractable, without status migrainosus: Secondary | ICD-10-CM | POA: Diagnosis not present

## 2022-12-21 ENCOUNTER — Other Ambulatory Visit: Payer: Self-pay | Admitting: Adult Health

## 2022-12-21 DIAGNOSIS — G4700 Insomnia, unspecified: Secondary | ICD-10-CM

## 2022-12-22 NOTE — Telephone Encounter (Signed)
Has appt with Gina tomorrow.  ?

## 2022-12-23 ENCOUNTER — Encounter: Payer: Self-pay | Admitting: Adult Health

## 2022-12-23 ENCOUNTER — Ambulatory Visit: Payer: BC Managed Care – PPO | Admitting: Adult Health

## 2022-12-23 DIAGNOSIS — F410 Panic disorder [episodic paroxysmal anxiety] without agoraphobia: Secondary | ICD-10-CM | POA: Diagnosis not present

## 2022-12-23 DIAGNOSIS — G4700 Insomnia, unspecified: Secondary | ICD-10-CM

## 2022-12-23 DIAGNOSIS — F331 Major depressive disorder, recurrent, moderate: Secondary | ICD-10-CM

## 2022-12-23 DIAGNOSIS — F411 Generalized anxiety disorder: Secondary | ICD-10-CM | POA: Diagnosis not present

## 2022-12-23 MED ORDER — HYDROCHLOROTHIAZIDE 12.5 MG PO CAPS: 12.5000 mg | ORAL_CAPSULE | Freq: Every morning | ORAL | 0 refills | Status: AC

## 2022-12-23 MED ORDER — CITALOPRAM HYDROBROMIDE 40 MG PO TABS: 40.0000 mg | ORAL_TABLET | Freq: Every day | ORAL | 2 refills | Status: DC

## 2022-12-23 MED ORDER — ESZOPICLONE 3 MG PO TABS: 3.0000 mg | ORAL_TABLET | Freq: Every evening | ORAL | 2 refills | Status: DC | PRN

## 2022-12-23 MED ORDER — LORAZEPAM 0.5 MG PO TABS: 0.5000 mg | ORAL_TABLET | Freq: Three times a day (TID) | ORAL | 2 refills | Status: DC

## 2022-12-23 MED ORDER — BUSPIRONE HCL 10 MG PO TABS: ORAL_TABLET | ORAL | 2 refills | Status: DC

## 2022-12-23 NOTE — Progress Notes (Signed)
Edithe Anschutz [id number] [birth year] 32 y.o.  Subjective:   Patient ID:  Donna Lloyd is a 32 y.o. (DOB 1991/06/22) female.  Chief Complaint: No chief complaint on file.   HPI Donna Lloyd presents to the office today for follow-up of MDD, GAD, panic attacks, and insomnia.  Describes mood today as "ok". Pleasant. Denies tearfulness. Mood symptoms - reports decreased anxiety, depression and irritability. Denies worry, rumination, and overthinking. Reports decreased panic attacks. Mood is consistent. Stating "I feel like I'm doing alright". Feels like medications are helpful. Stable interest and motivation. Taking medications as prescribed.  Energy levels stable. Active, does not have a regular exercise routine - walking daily. Enjoys some usual interests and activities. Lives with boyfriend. Has an 24 year old daughter - shares custody. Spending time with family. Appetite adequate. Weight gain - 367 pounds - 70". Sleeping difficulties. Averages 6 to 7 hours with Lunesta. Focus and concentration difficulties. Completing tasks. Managing aspects of household. 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 Denies SI or HI.  Denies AH or VH. History of self harm - cutting. 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 Denies alcohol use.  Previous medication trials: Zoloft      Review of Systems:  Review of Systems  Musculoskeletal:  Negative for gait problem.  Neurological:  Negative for tremors.  Psychiatric/Behavioral:         Please refer to HPI    Medications: I have reviewed the patient's current medications.  Current Outpatient Medications  Medication Sig Dispense Refill   albuterol (VENTOLIN HFA) 108 (90 Base) MCG/ACT inhaler Inhale 1-2 puffs into the lungs every 4 (four) hours as needed for shortness of breath.     busPIRone (BUSPAR) 10 MG tablet TAKE 1 TABLET(10 MG) BY MOUTH THREE TIMES DAILY 90 tablet 0   citalopram (CELEXA) 40 MG tablet Take 1 tablet (40 mg total) by  mouth daily. 30 tablet 2   Eszopiclone 3 MG TABS Take 1 tablet (3 mg total) by mouth at bedtime as needed. Take immediately before bedtime 30 tablet 2   hydrochlorothiazide (MICROZIDE) 12.5 MG capsule Take 1 capsule (12.5 mg total) by mouth every morning. 30 capsule 0   LORazepam (ATIVAN) 0.5 MG tablet Take 1 tablet (0.5 mg total) by mouth 3 (three) times daily. 90 tablet 2   No current facility-administered medications for this visit.    Medication Side Effects: None  Allergies:  Allergies  Allergen Reactions   Latex Hives   Penicillins Hives    Past Medical History:  Diagnosis Date   Allergy    Asthma    Depression     Past Medical History, Surgical history, Social history, and Family history were reviewed and updated as appropriate.   Please see review of systems for further details on the patient's review from today.   Objective:   Physical Exam:  There were no vitals taken for this visit.  Physical Exam Constitutional:      General: She is not in acute distress. Musculoskeletal:        General: No deformity.  Neurological:     Mental Status: She is alert and oriented to person, place, and time.     Coordination: Coordination normal.  Psychiatric:        Attention and Perception: Attention and perception normal. She does not perceive auditory or visual hallucinations.        Mood and Affect: Mood normal. Mood is not anxious or depressed. Affect is not  labile, blunt, angry or inappropriate.        Speech: Speech normal.        Behavior: Behavior normal.        Thought Content: Thought content normal. Thought content is not paranoid or delusional. Thought content does not include homicidal or suicidal ideation. Thought content does not include homicidal or suicidal plan.        Cognition and Memory: Cognition and memory normal.        Judgment: Judgment normal.     Comments: Insight intact     Lab Review:     Component Value Date/Time   NA 141 01/29/2022 2052    K 3.9 01/29/2022 2052   CL 108 01/29/2022 2052   CO2 26 01/29/2022 2052   GLUCOSE 82 01/29/2022 2052   BUN 14 01/29/2022 2052   CREATININE 0.72 01/29/2022 2052   CALCIUM 9.2 01/29/2022 2052   PROT 6.5 01/29/2022 2052   ALBUMIN 4.0 01/29/2022 2052   AST 21 01/29/2022 2052   ALT 34 01/29/2022 2052   ALKPHOS 57 01/29/2022 2052   BILITOT 0.9 01/29/2022 2052   GFRNONAA >60 01/29/2022 2052   GFRAA  08/05/2009 0840    NOT CALCULATED        The eGFR has been calculated using the MDRD equation. This calculation has not been validated in all clinical situations. eGFR's persistently <60 mL/min signify possible Chronic Kidney Disease.       Component Value Date/Time   WBC 10.4 01/29/2022 2052   RBC 4.80 01/29/2022 2052   HGB 13.7 01/29/2022 2052   HCT 42.4 01/29/2022 2052   PLT 246 01/29/2022 2052   MCV 88.3 01/29/2022 2052   MCH 28.5 01/29/2022 2052   MCHC 32.3 01/29/2022 2052   RDW 12.9 01/29/2022 2052   LYMPHSABS 3.5 01/29/2022 2052   MONOABS 0.6 01/29/2022 2052   EOSABS 0.2 01/29/2022 2052   BASOSABS 0.1 01/29/2022 2052    No results found for: "POCLITH", "LITHIUM"   No results found for: "PHENYTOIN", "PHENOBARB", "VALPROATE", "CBMZ"   .res Assessment: Plan:    Plan:  PDMP reviewed  Celexa 40mg  at bedtime Lunesta 3mg  daily Ativan 0.5mg  TID Buspar 10mg  TID  Set in a 30 day supply of HCTZ 12.5mg  - establishing a new PCP at [facility].   RTC 3 months  Patient advised to contact office with any questions, adverse effects, or acute worsening in signs and symptoms.  Discussed potential benefits, risk, and side effects of benzodiazepines to include potential risk of tolerance and dependence, as well as possible drowsiness.  Advised patient not to drive if experiencing drowsiness and to take lowest possible effective dose to minimize risk of dependence and tolerance.   There are no diagnoses linked to this encounter.   Please see After Visit Summary for patient  specific instructions.  Future Appointments  Date Time Provider [place]  12/23/2022  4:40 PM Koah Chisenhall, Berdie Ogren, NP [facility] None    No orders of the defined types were placed in this encounter.   -------------------------------

## 2023-01-15 DIAGNOSIS — E039 Hypothyroidism, unspecified: Secondary | ICD-10-CM | POA: Diagnosis not present

## 2023-01-15 DIAGNOSIS — I10 Essential (primary) hypertension: Secondary | ICD-10-CM | POA: Diagnosis not present

## 2023-01-29 ENCOUNTER — Other Ambulatory Visit: Payer: Self-pay | Admitting: Adult Health

## 2023-03-24 ENCOUNTER — Encounter: Payer: Self-pay | Admitting: Adult Health

## 2023-03-24 ENCOUNTER — Ambulatory Visit (INDEPENDENT_AMBULATORY_CARE_PROVIDER_SITE_OTHER): Payer: BC Managed Care – PPO | Admitting: Adult Health

## 2023-03-24 DIAGNOSIS — G4700 Insomnia, unspecified: Secondary | ICD-10-CM | POA: Diagnosis not present

## 2023-03-24 DIAGNOSIS — F410 Panic disorder [episodic paroxysmal anxiety] without agoraphobia: Secondary | ICD-10-CM | POA: Diagnosis not present

## 2023-03-24 DIAGNOSIS — F411 Generalized anxiety disorder: Secondary | ICD-10-CM

## 2023-03-24 DIAGNOSIS — F331 Major depressive disorder, recurrent, moderate: Secondary | ICD-10-CM

## 2023-03-24 MED ORDER — ESZOPICLONE 3 MG PO TABS: 3.0000 mg | ORAL_TABLET | Freq: Every evening | ORAL | 2 refills | Status: DC | PRN

## 2023-03-24 MED ORDER — LORAZEPAM 0.5 MG PO TABS: 0.5000 mg | ORAL_TABLET | Freq: Three times a day (TID) | ORAL | 2 refills | Status: DC

## 2023-03-24 MED ORDER — BUSPIRONE HCL 10 MG PO TABS: ORAL_TABLET | ORAL | 5 refills | Status: DC

## 2023-03-24 MED ORDER — CITALOPRAM HYDROBROMIDE 40 MG PO TABS
40.0000 mg | ORAL_TABLET | Freq: Every day | ORAL | 5 refills | Status: DC
Start: 2023-03-24 — End: 2023-07-08

## 2023-03-24 NOTE — Progress Notes (Signed)
Donna Lloyd [id number] [birth year] 32 y.o.  Subjective:   Patient ID:  Donna Lloyd is a 32 y.o. (DOB Jul 05, 1991) female.  Chief Complaint: No chief complaint on file.   HPI Donna Lloyd presents to the office today for follow-up of MDD, GAD, panic attacks, and insomnia.  Describes mood today as "ok". Pleasant. Denies tearfulness. Mood symptoms - reports decreased anxiety, depression. Reports more irritable lately - quicker to anger - uncertain of cause. Denies worry, rumination, and overthinking. Denies recent panic attacks. Mood is consistent. Stating "I'm feeling alright for the most part". Feels like medications are helpful. Stable interest and motivation. Taking medications as prescribed.  Energy levels stable. Active, does not have a regular exercise routine - walking daily. Enjoys some usual interests and activities. Lives with boyfriend and 79 year old daughter - shares custody. Spending time with family. Appetite adequate. Weight stable - 367 pounds - 70". Sleeping difficulties. Averages 6 to 7 hours with Lunesta. Focus and concentration difficulties. Completing tasks. Managing aspects of household. 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 Denies SI or HI.  Denies AH or VH. History of self harm - cutting. 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 Denies alcohol use.  Previous medication trials: Zoloft    Review of Systems:  Review of Systems  Musculoskeletal:  Negative for gait problem.  Neurological:  Negative for tremors.  Psychiatric/Behavioral:         Please refer to HPI    Medications: I have reviewed the patient's current medications.  Current Outpatient Medications  Medication Sig Dispense Refill   lisinopril-hydrochlorothiazide (ZESTORETIC) 10-12.5 MG tablet Take 1 tablet by mouth daily.     albuterol (VENTOLIN HFA) 108 (90 Base) MCG/ACT inhaler Inhale 1-2 puffs into the lungs every 4 (four) hours as needed for shortness of breath.     busPIRone  (BUSPAR) 10 MG tablet TAKE 1 TABLET(10 MG) BY MOUTH THREE TIMES DAILY 90 tablet 5   citalopram (CELEXA) 40 MG tablet Take 1 tablet (40 mg total) by mouth daily. 30 tablet 5   Eszopiclone 3 MG TABS Take 1 tablet (3 mg total) by mouth at bedtime as needed. Take immediately before bedtime 30 tablet 2   hydrochlorothiazide (MICROZIDE) 12.5 MG capsule Take 1 capsule (12.5 mg total) by mouth every morning. 30 capsule 0   LORazepam (ATIVAN) 0.5 MG tablet Take 1 tablet (0.5 mg total) by mouth 3 (three) times daily. 90 tablet 2   No current facility-administered medications for this visit.    Medication Side Effects: None  Allergies:  Allergies  Allergen Reactions   Latex Hives   Penicillins Hives    Past Medical History:  Diagnosis Date   Allergy    Asthma    Depression     Past Medical History, Surgical history, Social history, and Family history were reviewed and updated as appropriate.   Please see review of systems for further details on the patient's review from today.   Objective:   Physical Exam:  There were no vitals taken for this visit.  Physical Exam Constitutional:      General: She is not in acute distress. Musculoskeletal:        General: No deformity.  Neurological:     Mental Status: She is alert and oriented to person, place, and time.     Coordination: Coordination normal.  Psychiatric:        Attention and Perception: Attention and perception normal. She does not perceive auditory or  visual hallucinations.        Mood and Affect: Mood normal. Mood is not anxious or depressed. Affect is not labile, blunt, angry or inappropriate.        Speech: Speech normal.        Behavior: Behavior normal.        Thought Content: Thought content normal. Thought content is not paranoid or delusional. Thought content does not include homicidal or suicidal ideation. Thought content does not include homicidal or suicidal plan.        Cognition and Memory: Cognition and memory  normal.        Judgment: Judgment normal.     Comments: Insight intact     Lab Review:     Component Value Date/Time   NA 141 01/29/2022 2052   K 3.9 01/29/2022 2052   CL 108 01/29/2022 2052   CO2 26 01/29/2022 2052   GLUCOSE 82 01/29/2022 2052   BUN 14 01/29/2022 2052   CREATININE 0.72 01/29/2022 2052   CALCIUM 9.2 01/29/2022 2052   PROT 6.5 01/29/2022 2052   ALBUMIN 4.0 01/29/2022 2052   AST 21 01/29/2022 2052   ALT 34 01/29/2022 2052   ALKPHOS 57 01/29/2022 2052   BILITOT 0.9 01/29/2022 2052   GFRNONAA >60 01/29/2022 2052   GFRAA  08/05/2009 0840    NOT CALCULATED        The eGFR has been calculated using the MDRD equation. This calculation has not been validated in all clinical situations. eGFR's persistently <60 mL/min signify possible Chronic Kidney Disease.       Component Value Date/Time   WBC 10.4 01/29/2022 2052   RBC 4.80 01/29/2022 2052   HGB 13.7 01/29/2022 2052   HCT 42.4 01/29/2022 2052   PLT 246 01/29/2022 2052   MCV 88.3 01/29/2022 2052   MCH 28.5 01/29/2022 2052   MCHC 32.3 01/29/2022 2052   RDW 12.9 01/29/2022 2052   LYMPHSABS 3.5 01/29/2022 2052   MONOABS 0.6 01/29/2022 2052   EOSABS 0.2 01/29/2022 2052   BASOSABS 0.1 01/29/2022 2052    No results found for: "POCLITH", "LITHIUM"   No results found for: "PHENYTOIN", "PHENOBARB", "VALPROATE", "CBMZ"   .res Assessment: Plan:     Plan:  PDMP reviewed  Celexa 40mg  at bedtime Lunesta 3mg  daily Ativan 0.5mg  TID Buspar 10mg  TID  RTC 3 months  Patient advised to contact office with any questions, adverse effects, or acute worsening in signs and symptoms.  Discussed potential benefits, risk, and side effects of benzodiazepines to include potential risk of tolerance and dependence, as well as possible drowsiness.  Advised patient not to drive if experiencing drowsiness and to take lowest possible effective dose to minimize risk of dependence and tolerance.   Diagnoses and all orders  for this visit:  Generalized anxiety disorder -     busPIRone (BUSPAR) 10 MG tablet; TAKE 1 TABLET(10 MG) BY MOUTH THREE TIMES DAILY -     citalopram (CELEXA) 40 MG tablet; Take 1 tablet (40 mg total) by mouth daily. -     LORazepam (ATIVAN) 0.5 MG tablet; Take 1 tablet (0.5 mg total) by mouth 3 (three) times daily.  Major depressive disorder, recurrent episode, moderate (HCC) -     citalopram (CELEXA) 40 MG tablet; Take 1 tablet (40 mg total) by mouth daily.  Panic attacks -     citalopram (CELEXA) 40 MG tablet; Take 1 tablet (40 mg total) by mouth daily. -     LORazepam (ATIVAN) 0.5 MG tablet;  Take 1 tablet (0.5 mg total) by mouth 3 (three) times daily.  Insomnia, unspecified type -     Eszopiclone 3 MG TABS; Take 1 tablet (3 mg total) by mouth at bedtime as needed. Take immediately before bedtime     Please see After Visit Summary for patient specific instructions.  No future appointments.   No orders of the defined types were placed in this encounter.   -------------------------------

## 2023-04-16 DIAGNOSIS — I10 Essential (primary) hypertension: Secondary | ICD-10-CM | POA: Diagnosis not present

## 2023-04-16 DIAGNOSIS — E282 Polycystic ovarian syndrome: Secondary | ICD-10-CM | POA: Diagnosis not present

## 2023-04-16 DIAGNOSIS — G43909 Migraine, unspecified, not intractable, without status migrainosus: Secondary | ICD-10-CM | POA: Diagnosis not present

## 2023-06-30 ENCOUNTER — Ambulatory Visit: Payer: BC Managed Care – PPO | Admitting: Adult Health

## 2023-07-08 ENCOUNTER — Ambulatory Visit: Payer: BC Managed Care – PPO | Admitting: Adult Health

## 2023-07-08 ENCOUNTER — Encounter: Payer: Self-pay | Admitting: Adult Health

## 2023-07-08 DIAGNOSIS — G4700 Insomnia, unspecified: Secondary | ICD-10-CM

## 2023-07-08 DIAGNOSIS — F410 Panic disorder [episodic paroxysmal anxiety] without agoraphobia: Secondary | ICD-10-CM

## 2023-07-08 DIAGNOSIS — F331 Major depressive disorder, recurrent, moderate: Secondary | ICD-10-CM

## 2023-07-08 DIAGNOSIS — F411 Generalized anxiety disorder: Secondary | ICD-10-CM

## 2023-07-08 MED ORDER — ESZOPICLONE 3 MG PO TABS: 3.0000 mg | ORAL_TABLET | Freq: Every evening | ORAL | 2 refills | Status: DC | PRN

## 2023-07-08 MED ORDER — BUSPIRONE HCL 10 MG PO TABS
ORAL_TABLET | ORAL | 5 refills | Status: DC
Start: 2023-07-08 — End: 2024-02-12

## 2023-07-08 MED ORDER — LORAZEPAM 0.5 MG PO TABS
0.5000 mg | ORAL_TABLET | Freq: Three times a day (TID) | ORAL | 2 refills | Status: DC
Start: 2023-07-08 — End: 2024-02-12

## 2023-07-08 MED ORDER — CITALOPRAM HYDROBROMIDE 40 MG PO TABS
40.0000 mg | ORAL_TABLET | Freq: Every day | ORAL | 5 refills | Status: DC
Start: 2023-07-08 — End: 2024-02-12

## 2023-07-08 NOTE — Progress Notes (Signed)
Donna Lloyd [id number] Jul 15, 1991 32 y.o.  Subjective:   Patient ID:  Donna Lloyd is a 32 y.o. (DOB 30-Mar-1991) female.  Chief Complaint: No chief complaint on file.   HPI [name] presents to the office today for follow-up of MDD, GAD, panic attacks, and insomnia.  Describes mood today as "ok". Pleasant. Denies tearfulness. Mood symptoms - reports some situational anxiety and depression. Reports decreased irritability. Denies panic attacks. Denies worry and rumination. Reports a little over thinking. Mood is consistent. Stating "I feel like I'm doing ok". Feels like medications are helpful. Stable interest and motivation. Taking medications as prescribed.  Energy levels stable. Active, does not have a regular exercise routine - walking daily. Enjoys some usual interests and activities. Lives with boyfriend and 3 year old daughter - shares custody. Spending time with family. Appetite adequate. Weight stable - 367 pounds - 70". Sleeping difficulties. Averages 3 to 4 hours with Lunesta. Focus and concentration difficulties. Completing tasks. Managing aspects of household. 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 Denies SI or HI.  Denies AH or VH. History of self harm - cutting. 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 Denies alcohol use.  Previous medication trials: Zoloft   Review of Systems:  Review of Systems  Musculoskeletal:  Negative for gait problem.  Neurological:  Negative for tremors.  Psychiatric/Behavioral:         Please refer to HPI    Medications: I have reviewed the patient's current medications.  Current Outpatient Medications  Medication Sig Dispense Refill   albuterol (VENTOLIN HFA) 108 (90 Base) MCG/ACT inhaler Inhale 1-2 puffs into the lungs every 4 (four) hours as needed for shortness of breath.     busPIRone (BUSPAR) 10 MG tablet TAKE 1 TABLET(10 MG) BY MOUTH THREE TIMES DAILY 90 tablet 5   citalopram (CELEXA) 40 MG tablet Take 1 tablet  (40 mg total) by mouth daily. 30 tablet 5   Eszopiclone 3 MG TABS Take 1 tablet (3 mg total) by mouth at bedtime as needed. Take immediately before bedtime 30 tablet 2   hydrochlorothiazide (MICROZIDE) 12.5 MG capsule Take 1 capsule (12.5 mg total) by mouth every morning. 30 capsule 0   lisinopril-hydrochlorothiazide (ZESTORETIC) 10-12.5 MG tablet Take 1 tablet by mouth daily.     LORazepam (ATIVAN) 0.5 MG tablet Take 1 tablet (0.5 mg total) by mouth 3 (three) times daily. 90 tablet 2   No current facility-administered medications for this visit.    Medication Side Effects: None  Allergies:  Allergies  Allergen Reactions   Latex Hives   Penicillins Hives    Past Medical History:  Diagnosis Date   Allergy    Asthma    Depression     Past Medical History, Surgical history, Social history, and Family history were reviewed and updated as appropriate.   Please see review of systems for further details on the patient's review from today.   Objective:   Physical Exam:  There were no vitals taken for this visit.  Physical Exam Constitutional:      General: She is not in acute distress. Musculoskeletal:        General: No deformity.  Neurological:     Mental Status: She is alert and oriented to person, place, and time.     Coordination: Coordination normal.  Psychiatric:        Attention and Perception: Attention and perception normal. She does not perceive auditory or visual hallucinations.  Mood and Affect: Mood normal. Mood is not anxious or depressed. Affect is not labile, blunt, angry or inappropriate.        Speech: Speech normal.        Behavior: Behavior normal.        Thought Content: Thought content normal. Thought content is not paranoid or delusional. Thought content does not include homicidal or suicidal ideation. Thought content does not include homicidal or suicidal plan.        Cognition and Memory: Cognition and memory normal.        Judgment: Judgment  normal.     Comments: Insight intact     Lab Review:     Component Value Date/Time   NA 141 01/29/2022 2052   K 3.9 01/29/2022 2052   CL 108 01/29/2022 2052   CO2 26 01/29/2022 2052   GLUCOSE 82 01/29/2022 2052   BUN 14 01/29/2022 2052   CREATININE 0.72 01/29/2022 2052   CALCIUM 9.2 01/29/2022 2052   PROT 6.5 01/29/2022 2052   ALBUMIN 4.0 01/29/2022 2052   AST 21 01/29/2022 2052   ALT 34 01/29/2022 2052   ALKPHOS 57 01/29/2022 2052   BILITOT 0.9 01/29/2022 2052   GFRNONAA >60 01/29/2022 2052   GFRAA  08/05/2009 0840    NOT CALCULATED        The eGFR has been calculated using the MDRD equation. This calculation has not been validated in all clinical situations. eGFR's persistently <60 mL/min signify possible Chronic Kidney Disease.       Component Value Date/Time   WBC 10.4 01/29/2022 2052   RBC 4.80 01/29/2022 2052   HGB 13.7 01/29/2022 2052   HCT 42.4 01/29/2022 2052   PLT 246 01/29/2022 2052   MCV 88.3 01/29/2022 2052   MCH 28.5 01/29/2022 2052   MCHC 32.3 01/29/2022 2052   RDW 12.9 01/29/2022 2052   LYMPHSABS 3.5 01/29/2022 2052   MONOABS 0.6 01/29/2022 2052   EOSABS 0.2 01/29/2022 2052   BASOSABS 0.1 01/29/2022 2052    No results found for: "POCLITH", "LITHIUM"   No results found for: "PHENYTOIN", "PHENOBARB", "VALPROATE", "CBMZ"   .res Assessment: Plan:    Plan:  PDMP reviewed  Celexa 40mg  at bedtime Lunesta 3mg  daily Ativan 0.5mg  TID Buspar 10mg  TID  RTC 3 months  Patient advised to contact office with any questions, adverse effects, or acute worsening in signs and symptoms.  Discussed potential benefits, risk, and side effects of benzodiazepines to include potential risk of tolerance and dependence, as well as possible drowsiness.  Advised patient not to drive if experiencing drowsiness and to take lowest possible effective dose to minimize risk of dependence and tolerance.   There are no diagnoses linked to this encounter.   Please  see After Visit Summary for patient specific instructions.  No future appointments.  No orders of the defined types were placed in this encounter.   -------------------------------

## 2023-07-21 DIAGNOSIS — Z0000 Encounter for general adult medical examination without abnormal findings: Secondary | ICD-10-CM | POA: Diagnosis not present

## 2023-07-21 DIAGNOSIS — Z23 Encounter for immunization: Secondary | ICD-10-CM | POA: Diagnosis not present

## 2023-07-21 DIAGNOSIS — I10 Essential (primary) hypertension: Secondary | ICD-10-CM | POA: Diagnosis not present

## 2023-07-21 DIAGNOSIS — E282 Polycystic ovarian syndrome: Secondary | ICD-10-CM | POA: Diagnosis not present

## 2023-07-21 DIAGNOSIS — E039 Hypothyroidism, unspecified: Secondary | ICD-10-CM | POA: Diagnosis not present

## 2023-07-28 DIAGNOSIS — E039 Hypothyroidism, unspecified: Secondary | ICD-10-CM | POA: Diagnosis not present

## 2023-07-28 DIAGNOSIS — G43909 Migraine, unspecified, not intractable, without status migrainosus: Secondary | ICD-10-CM | POA: Diagnosis not present

## 2023-07-28 DIAGNOSIS — I10 Essential (primary) hypertension: Secondary | ICD-10-CM | POA: Diagnosis not present

## 2023-07-28 DIAGNOSIS — E282 Polycystic ovarian syndrome: Secondary | ICD-10-CM | POA: Diagnosis not present

## 2023-07-28 DIAGNOSIS — Z13220 Encounter for screening for lipoid disorders: Secondary | ICD-10-CM | POA: Diagnosis not present

## 2023-08-07 ENCOUNTER — Telehealth: Payer: Self-pay | Admitting: Adult Health

## 2023-08-07 NOTE — Telephone Encounter (Signed)
Patient asking for updated ESA letter.

## 2023-08-07 NOTE — Telephone Encounter (Signed)
Pt LVM@ 10:37a that she needs a renewal letter for her ESA.  Pls call her when it's ready to be picked up.  Next appt 1/15

## 2023-08-08 NOTE — Telephone Encounter (Signed)
There is a letter in media, 06/11/22.

## 2023-08-11 ENCOUNTER — Telehealth: Payer: Self-pay | Admitting: Adult Health

## 2023-08-11 NOTE — Telephone Encounter (Signed)
Patient checking status on ESA letter.

## 2023-08-11 NOTE — Telephone Encounter (Signed)
Pt called checking the status of the esa letter she needs . She said that she needs the letter by today

## 2023-08-28 DIAGNOSIS — K625 Hemorrhage of anus and rectum: Secondary | ICD-10-CM | POA: Diagnosis not present

## 2023-08-28 DIAGNOSIS — R109 Unspecified abdominal pain: Secondary | ICD-10-CM | POA: Diagnosis not present

## 2023-10-02 ENCOUNTER — Telehealth: Payer: Self-pay | Admitting: Adult Health

## 2023-10-02 NOTE — Telephone Encounter (Signed)
 Eszopiclone  and Citalopram  sent to   [id number] [place], [place] - [address] AT [facility] [address] [place], [place] [address] Phone: [phone]  Fax: [phone]   Before the insurance expires tomorrow. She will have new insurance on March 18.   She has appt on 1/15, but I LVM to see if she wants to push that out to March due to insurance.

## 2023-10-02 NOTE — Telephone Encounter (Signed)
 I show patient has RF available for both medications.   Rx for citalopram was sent 10/15 with 5 RF.  Rx for Eszopiclone 3 Mg tablet also sent on 10/15 with 2 RF. Per PMPD last filled 10/15.  Notified patient of the above.

## 2023-10-02 NOTE — Telephone Encounter (Signed)
 Pt called at 3:14p.  She said she just found out she won't have insurance after tomorrow.  She is requesting to have

## 2023-10-08 ENCOUNTER — Ambulatory Visit: Payer: BC Managed Care – PPO | Admitting: Adult Health

## 2024-01-19 DIAGNOSIS — E282 Polycystic ovarian syndrome: Secondary | ICD-10-CM | POA: Diagnosis not present

## 2024-01-19 DIAGNOSIS — I10 Essential (primary) hypertension: Secondary | ICD-10-CM | POA: Diagnosis not present

## 2024-02-12 ENCOUNTER — Telehealth: Payer: Self-pay | Admitting: Adult Health

## 2024-02-12 ENCOUNTER — Encounter: Payer: Self-pay | Admitting: Adult Health

## 2024-02-12 DIAGNOSIS — G4700 Insomnia, unspecified: Secondary | ICD-10-CM | POA: Diagnosis not present

## 2024-02-12 DIAGNOSIS — F411 Generalized anxiety disorder: Secondary | ICD-10-CM

## 2024-02-12 DIAGNOSIS — F410 Panic disorder [episodic paroxysmal anxiety] without agoraphobia: Secondary | ICD-10-CM | POA: Diagnosis not present

## 2024-02-12 DIAGNOSIS — F339 Major depressive disorder, recurrent, unspecified: Secondary | ICD-10-CM

## 2024-02-12 DIAGNOSIS — F331 Major depressive disorder, recurrent, moderate: Secondary | ICD-10-CM

## 2024-02-12 MED ORDER — CITALOPRAM HYDROBROMIDE 40 MG PO TABS
40.0000 mg | ORAL_TABLET | Freq: Every day | ORAL | 5 refills | Status: AC
Start: 2024-02-12 — End: ?

## 2024-02-12 MED ORDER — LORAZEPAM 0.5 MG PO TABS: 0.5000 mg | ORAL_TABLET | Freq: Three times a day (TID) | ORAL | 2 refills | Status: AC

## 2024-02-12 MED ORDER — BUSPIRONE HCL 15 MG PO TABS
ORAL_TABLET | ORAL | 5 refills | Status: AC
Start: 2024-02-12 — End: ?

## 2024-02-12 MED ORDER — ESZOPICLONE 3 MG PO TABS
3.0000 mg | ORAL_TABLET | Freq: Every evening | ORAL | 2 refills | Status: AC | PRN
Start: 2024-02-12 — End: ?

## 2024-02-12 NOTE — Progress Notes (Signed)
 Surah Pelley [id number] 1991/07/17 32 y.o.  Virtual Visit via Video Note  I connected with pt @ on 02/12/24 at  4:30 PM EDT by a video enabled telemedicine application and verified that I am speaking with the correct person using two identifiers.   I discussed the limitations of evaluation and management by telemedicine and the availability of in person appointments. The patient expressed understanding and agreed to proceed.  I discussed the assessment and treatment plan with the patient. The patient was provided an opportunity to ask questions and all were answered. The patient agreed with the plan and demonstrated an understanding of the instructions.   The patient was advised to call back or seek an in-person evaluation if the symptoms worsen or if the condition fails to improve as anticipated.  I provided 25 minutes of non-face-to-face time during this encounter.  The patient was located at home.  The provider was located at [facility].   Reagan Camera, NP   Subjective:   Patient ID:  Donna Lloyd is a 33 y.o. (DOB [birth year]) female.  Chief Complaint: No chief complaint on file.   HPI Donna Lloyd presents for follow-up of MDD, GAD, panic attacks, and insomnia.  Describes mood today as "ok". Pleasant. Denies tearfulness. Mood symptoms - reports some situational anxiety and  irritability. Denies depression. Reports stable interest and motivation. Reports one recent panic attacks. Denies worry, rumination and over thinking. Reports mood has been fluctuating - having good and bad days. Stating "overall, I feel like I'm doing alright". Feels like medications are helpful. Taking medications as prescribed.  Energy levels stable. Active, does not have a regular exercise routine - walking daily. Enjoys some usual interests and activities. Lives with boyfriend and 62 year old daughter - shares custody. Spending time with family. Appetite adequate. Weight loss - 354 pounds -  70". Sleeping difficulties. Averages 6 to 7 hours with Lunesta . Focus and concentration difficulties. Completing tasks. Managing aspects of household. 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 Denies SI or HI.  Denies AH or VH. History of self harm - cutting. 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 Denies alcohol use.  Previous medication trials: Zoloft  Review of Systems:  Review of Systems  Musculoskeletal:  Negative for gait problem.  Neurological:  Negative for tremors.  Psychiatric/Behavioral:         Please refer to HPI    Medications: I have reviewed the patient's current medications.  Current Outpatient Medications  Medication Sig Dispense Refill   albuterol  (VENTOLIN  HFA) 108 (90 Base) MCG/ACT inhaler Inhale 1-2 puffs into the lungs every 4 (four) hours as needed for shortness of breath.     busPIRone  (BUSPAR ) 10 MG tablet TAKE 1 TABLET(10 MG) BY MOUTH THREE TIMES DAILY 90 tablet 5   citalopram  (CELEXA ) 40 MG tablet Take 1 tablet (40 mg total) by mouth daily. 30 tablet 5   Eszopiclone  3 MG TABS Take 1 tablet (3 mg total) by mouth at bedtime as needed. Take immediately before bedtime 30 tablet 2   hydrochlorothiazide  (MICROZIDE ) 12.5 MG capsule Take 1 capsule (12.5 mg total) by mouth every morning. 30 capsule 0   lisinopril-hydrochlorothiazide  (ZESTORETIC) 10-12.5 MG tablet Take 1 tablet by mouth daily.     LORazepam  (ATIVAN ) 0.5 MG tablet Take 1 tablet (0.5 mg total) by mouth 3 (three) times daily. 90 tablet 2   metFORMIN (GLUCOPHAGE-XR) 500 MG 24 hr tablet 1 tablet with evening meal Orally Once a  day for 90 days     No current facility-administered medications for this visit.    Medication Side Effects: None  Allergies:  Allergies  Allergen Reactions   Latex Hives   Penicillins Hives    Past Medical History:  Diagnosis Date   Allergy    Asthma    Depression     No family history on file.  Social History   Socioeconomic History   Marital  status: Single    Spouse name: Not on file   Number of children: Not on file   Years of education: Not on file   Highest education level: Not on file  Occupational History   Not on file  Tobacco Use   Smoking status: Every Day   Smokeless tobacco: Current  Substance and Sexual Activity   Alcohol use: Yes   Drug use: Never   Sexual activity: Not on file  Other Topics Concern   Not on file  Social History Narrative   Not on file   Social Drivers of Health   Financial Resource Strain: Not on file  Food Insecurity: Not on file  Transportation Needs: Not on file  Physical Activity: Not on file  Stress: Not on file  Social Connections: Not on file  Intimate Partner Violence: Not on file    Past Medical History, Surgical history, Social history, and Family history were reviewed and updated as appropriate.   Please see review of systems for further details on the patient's review from today.   Objective:   Physical Exam:  There were no vitals taken for this visit.  Physical Exam Constitutional:      General: She is not in acute distress. Musculoskeletal:        General: No deformity.  Neurological:     Mental Status: She is alert and oriented to person, place, and time.     Coordination: Coordination normal.  Psychiatric:        Attention and Perception: Attention and perception normal. She does not perceive auditory or visual hallucinations.        Mood and Affect: Mood normal. Mood is not anxious or depressed. Affect is not labile, blunt, angry or inappropriate.        Speech: Speech normal.        Behavior: Behavior normal.        Thought Content: Thought content normal. Thought content is not paranoid or delusional. Thought content does not include homicidal or suicidal ideation. Thought content does not include homicidal or suicidal plan.        Cognition and Memory: Cognition and memory normal.        Judgment: Judgment normal.     Comments: Insight intact     Lab Review:     Component Value Date/Time   NA 141 01/29/2022 2052   K 3.9 01/29/2022 2052   CL 108 01/29/2022 2052   CO2 26 01/29/2022 2052   GLUCOSE 82 01/29/2022 2052   BUN 14 01/29/2022 2052   CREATININE 0.72 01/29/2022 2052   CALCIUM 9.2 01/29/2022 2052   PROT 6.5 01/29/2022 2052   ALBUMIN 4.0 01/29/2022 2052   AST 21 01/29/2022 2052   ALT 34 01/29/2022 2052   ALKPHOS 57 01/29/2022 2052   BILITOT 0.9 01/29/2022 2052   GFRNONAA >60 01/29/2022 2052   GFRAA  08/05/2009 0840    NOT CALCULATED        The eGFR has been calculated using the MDRD equation. This calculation has not been validated  in all clinical situations. eGFR's persistently <60 mL/min signify possible Chronic Kidney Disease.       Component Value Date/Time   WBC 10.4 01/29/2022 2052   RBC 4.80 01/29/2022 2052   HGB 13.7 01/29/2022 2052   HCT 42.4 01/29/2022 2052   PLT 246 01/29/2022 2052   MCV 88.3 01/29/2022 2052   MCH 28.5 01/29/2022 2052   MCHC 32.3 01/29/2022 2052   RDW 12.9 01/29/2022 2052   LYMPHSABS 3.5 01/29/2022 2052   MONOABS 0.6 01/29/2022 2052   EOSABS 0.2 01/29/2022 2052   BASOSABS 0.1 01/29/2022 2052    No results found for: "POCLITH", "LITHIUM"   No results found for: "PHENYTOIN", "PHENOBARB", "VALPROATE", "CBMZ"   .res Assessment: Plan:    Plan:  PDMP reviewed  Celexa  40mg  at bedtime Lunesta  3mg  daily Ativan  0.5mg  TID  Increase Buspar  10mg  to 15mg  TID  RTC 3 months  15 minutes spent dedicated to the care of this patient on the date of this encounter to include pre-visit review of records, ordering of medication, post visit documentation, and face-to-face time with the patient discussing MDD, GAD, panic attacks, and insomnia. Discussed increasing dose of Buspar .   Patient advised to contact office with any questions, adverse effects, or acute worsening in signs and symptoms.  Discussed potential benefits, risk, and side effects of benzodiazepines to include  potential risk of tolerance and dependence, as well as possible drowsiness.  Advised patient not to drive if experiencing drowsiness and to take lowest possible effective dose to minimize risk of dependence and tolerance.   There are no diagnoses linked to this encounter.   Please see After Visit Summary for patient specific instructions.  Future Appointments  Date Time Provider Department Center  02/12/2024  4:30 PM Babette Stum Nattalie, NP [facility] None    No orders of the defined types were placed in this encounter.     -------------------------------
